# Patient Record
Sex: Male | Born: 1990
Health system: Southern US, Community
[De-identification: ages and names within clinical notes are randomized; demographics above are authoritative.]

## PROBLEM LIST (undated history)

## (undated) DIAGNOSIS — F319 Bipolar disorder, unspecified: Secondary | ICD-10-CM

---
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
## 2015-09-13 DIAGNOSIS — F3173 Bipolar disorder, in partial remission, most recent episode manic: Secondary | ICD-10-CM | POA: Diagnosis not present

## 2015-11-07 ENCOUNTER — Emergency Department (HOSPITAL_COMMUNITY)
Admission: EM | Admit: 2015-11-07 | Discharge: 2015-11-07 | Disposition: A | Discharge status: Home / Self Care | Payer: Self-pay | Attending: Emergency Medicine | Admitting: Emergency Medicine

## 2015-11-07 ENCOUNTER — Encounter (HOSPITAL_COMMUNITY): Payer: Self-pay

## 2015-11-07 DIAGNOSIS — Z87891 Personal history of nicotine dependence: Secondary | ICD-10-CM | POA: Diagnosis not present

## 2015-11-07 DIAGNOSIS — J069 Acute upper respiratory infection, unspecified: Secondary | ICD-10-CM | POA: Insufficient documentation

## 2015-11-07 DIAGNOSIS — B9789 Other viral agents as the cause of diseases classified elsewhere: Secondary | ICD-10-CM

## 2015-11-07 DIAGNOSIS — R000 Tachycardia, unspecified: Secondary | ICD-10-CM | POA: Insufficient documentation

## 2015-11-07 DIAGNOSIS — R05 Cough: Secondary | ICD-10-CM | POA: Diagnosis not present

## 2015-11-07 DIAGNOSIS — Z8659 Personal history of other mental and behavioral disorders: Secondary | ICD-10-CM | POA: Insufficient documentation

## 2015-11-07 HISTORY — DX: Bipolar disorder, unspecified: F31.9

## 2015-11-07 MED ORDER — BENZONATATE 100 MG PO CAPS: 100.0000 mg | ORAL_CAPSULE | Freq: Three times a day (TID) | ORAL | Status: DC

## 2015-11-07 NOTE — ED Provider Notes (Signed)
CSN: [id number]     Arrival date & time 11/07/15  [id number] History  By signing my name below, I, Doreatha MartinEva Mathews, attest that this documentation has been prepared under the direction and in the presence of Derwood KaplanAnkit Benna Arno, MD. Electronically Signed: Doreatha MartinEva Mathews, ED Scribe. 11/07/2015. 4:00 AM.    Chief Complaint  Patient presents with  . Cough   The history is provided by the patient. No language interpreter was used.   HPI Comments: Shawn Ford is a 25 y.o. male who presents to the Emergency Department complaining of moderate, intermittent productive cough with green/yellow sputum onset yesterday with associated HA, generalized myalgias, chest tenderness with coughing. Pt states that he came to the ED because he left work early at [employer] d/t his illness and was required to get a doctor's note. Pt notes sick contact with sister who has similar symptoms. Pt denies taking OTC medications at home to improve symptoms. He denies SOB, sore throat.    Past Medical History  Diagnosis Date  . Bipolar 1 disorder [facility](HCC)    Past Surgical History  Procedure Laterality Date  . Tympanostomy tube placement Bilateral 1995   No family history on file. Social History  Substance Use Topics  . Smoking status: Former Games developermoker  . Smokeless tobacco: Never Used  . Alcohol Use: Yes     Comment: socially    Review of Systems A complete 10 system review of systems was obtained and all systems are negative except as noted in the HPI and PMH.    Allergies  Review of patient's allergies indicates no known allergies.  Home Medications   Prior to Admission medications   Medication Sig Start Date End Date Taking? Authorizing Provider  benzonatate (TESSALON) 100 MG capsule Take 1 capsule (100 mg total) by mouth every 8 (eight) hours. 11/07/15   Donis Pinder, MD   BP 139/82 mmHg  Pulse 96  Temp(Src) 99.4 F (37.4 C) (Oral)  Resp 20  Ht 5\' 9"  (1.753 m)  Wt 185 lb (83.915 kg)  BMI 27.31 kg/m2  SpO2 99% Physical  Exam  Constitutional: He is oriented to person, place, and time. He appears well-developed and well-nourished.  HENT:  Head: Normocephalic and atraumatic.  Eyes: Conjunctivae and EOM are normal. Pupils are equal, round, and reactive to light.  Neck: Normal range of motion. Neck supple.  Cardiovascular: Regular rhythm and normal heart sounds.  Tachycardia present.  Exam reveals no gallop and no friction rub.   No murmur heard. Pulmonary/Chest: Effort normal and breath sounds normal. No respiratory distress. He has no wheezes. He has no rales.  Lungs CTA bilaterally.   Abdominal: He exhibits no distension.  Musculoskeletal: Normal range of motion.  Lymphadenopathy:    He has cervical adenopathy.  Neurological: He is alert and oriented to person, place, and time.  Skin: Skin is warm and dry.  Psychiatric: He has a normal mood and affect. His behavior is normal.  Nursing note and vitals reviewed.   ED Course  Procedures (including critical care time) DIAGNOSTIC STUDIES: Oxygen Saturation is 96% on RA, adequate by my interpretation.    COORDINATION OF CARE: 3:51 AM Discussed treatment plan with pt at bedside which includes symptomatic therapy and pt agreed to plan.    MDM   Final diagnoses:  Viral URI with cough    I personally performed the services described in this documentation, which was scribed in my presence. The recorded information has been reviewed and is accurate.   Pt comes  in with, what appears to be early onset URI. No fevers, no pleuritic chest pain with the cough, lungs are clear. WE will not get CXR or any lab work due to that. PT needs a work note- and that is the primary reason for ER visit -and we shall provide that.   Derwood Kaplan, MD 11/07/15 [phone]

## 2015-11-07 NOTE — Discharge Instructions (Signed)
Upper Respiratory Infection, Adult Most upper respiratory infections (URIs) are a viral infection of the air passages leading to the lungs. A URI affects the nose, throat, and upper air passages. The most common type of URI is nasopharyngitis and is typically referred to as "the common cold." URIs run their course and usually go away on their own. Most of the time, a URI does not require medical attention, but sometimes a bacterial infection in the upper airways can follow a viral infection. This is called a secondary infection. Sinus and middle ear infections are common types of secondary upper respiratory infections. Bacterial pneumonia can also complicate a URI. A URI can worsen asthma and chronic obstructive pulmonary disease (COPD). Sometimes, these complications can require emergency medical care and may be life threatening.  CAUSES Almost all URIs are caused by viruses. A virus is a type of germ and can spread from one person to another.  RISKS FACTORS You may be at risk for a URI if:   You smoke.   You have chronic heart or lung disease.  You have a weakened defense (immune) system.   You are very young or very old.   You have nasal allergies or asthma.  You work in crowded or poorly ventilated areas.  You work in health care facilities or schools. SIGNS AND SYMPTOMS  Symptoms typically develop 2-3 days after you come in contact with a cold virus. Most viral URIs last 7-10 days. However, viral URIs from the influenza virus (flu virus) can last 14-18 days and are typically more severe. Symptoms may include:   Runny or stuffy (congested) nose.   Sneezing.   Cough.   Sore throat.   Headache.   Fatigue.   Fever.   Loss of appetite.   Pain in your forehead, behind your eyes, and over your cheekbones (sinus pain).  Muscle aches.  DIAGNOSIS  Your health care provider may diagnose a URI by:  Physical exam.  Tests to check that your symptoms are not due to  another condition such as:  Strep throat.  Sinusitis.  Pneumonia.  Asthma. TREATMENT  A URI goes away on its own with time. It cannot be cured with medicines, but medicines may be prescribed or recommended to relieve symptoms. Medicines may help:  Reduce your fever.  Reduce your cough.  Relieve nasal congestion. HOME CARE INSTRUCTIONS   Take medicines only as directed by your health care provider.   Gargle warm saltwater or take cough drops to comfort your throat as directed by your health care provider.  Use a warm mist humidifier or inhale steam from a shower to increase air moisture. This may make it easier to breathe.  Drink enough fluid to keep your urine clear or pale yellow.   Eat soups and other clear broths and maintain good nutrition.   Rest as needed.   Return to work when your temperature has returned to normal or as your health care provider advises. You may need to stay home longer to avoid infecting others. You can also use a face mask and careful hand washing to prevent spread of the virus.  Increase the usage of your inhaler if you have asthma.   Do not use any tobacco products, including cigarettes, chewing tobacco, or electronic cigarettes. If you need help quitting, ask your health care provider. PREVENTION  The best way to protect yourself from getting a cold is to practice good hygiene.   Avoid oral or hand contact with people with cold   symptoms.   Wash your hands often if contact occurs.  There is no clear evidence that vitamin C, vitamin E, echinacea, or exercise reduces the chance of developing a cold. However, it is always recommended to get plenty of rest, exercise, and practice good nutrition.  SEEK MEDICAL CARE IF:   You are getting worse rather than better.   Your symptoms are not controlled by medicine.   You have chills.  You have worsening shortness of breath.  You have brown or red mucus.  You have yellow or brown nasal  discharge.  You have pain in your face, especially when you bend forward.  You have a fever.  You have swollen neck glands.  You have pain while swallowing.  You have white areas in the back of your throat. SEEK IMMEDIATE MEDICAL CARE IF:   You have severe or persistent:  Headache.  Ear pain.  Sinus pain.  Chest pain.  You have chronic lung disease and any of the following:  Wheezing.  Prolonged cough.  Coughing up blood.  A change in your usual mucus.  You have a stiff neck.  You have changes in your:  Vision.  Hearing.  Thinking.  Mood. MAKE SURE YOU:   Understand these instructions.  Will watch your condition.  Will get help right away if you are not doing well or get worse.   This information is not intended to replace advice given to you by your health care provider. Make sure you discuss any questions you have with your health care provider.   Document Released: 01/31/2001 Document Revised: 12/22/2014 Document Reviewed: 11/12/2013 Elsevier Interactive Patient Education 2016 Elsevier Inc.  

## 2015-11-07 NOTE — ED Notes (Signed)
Dr. Nanavati at bedside 

## 2015-11-07 NOTE — ED Notes (Signed)
Per pt he left work early, was coughing and had a headache and felt tired. Pt states "I felt like I was having a cold". Pt states that his boss said he needed a work note for leaving early and this was the only place he knew that was open. Pt denies productive cough. Pt states his symptoms started tonight. Pt did not take any medication.

## 2017-01-24 ENCOUNTER — Emergency Department (HOSPITAL_COMMUNITY): Payer: 59

## 2017-01-24 ENCOUNTER — Encounter (HOSPITAL_COMMUNITY)
Admission: EM | Disposition: A | Discharge status: Home / Self Care | Payer: Self-pay | Source: Home / Self Care | Attending: Orthopedic Surgery

## 2017-01-24 ENCOUNTER — Encounter (HOSPITAL_COMMUNITY): Payer: Self-pay | Admitting: Emergency Medicine

## 2017-01-24 ENCOUNTER — Inpatient Hospital Stay (HOSPITAL_COMMUNITY)
Admission: EM | Admit: 2017-01-24 | Discharge: 2017-01-25 | DRG: 512 | Disposition: A | Discharge status: Home / Self Care | Payer: 59 | Attending: Orthopedic Surgery | Admitting: Orthopedic Surgery

## 2017-01-24 ENCOUNTER — Emergency Department (HOSPITAL_COMMUNITY): Payer: 59 | Admitting: Registered Nurse

## 2017-01-24 DIAGNOSIS — F17200 Nicotine dependence, unspecified, uncomplicated: Secondary | ICD-10-CM | POA: Diagnosis present

## 2017-01-24 DIAGNOSIS — F319 Bipolar disorder, unspecified: Secondary | ICD-10-CM | POA: Diagnosis not present

## 2017-01-24 DIAGNOSIS — S52332A Displaced oblique fracture of shaft of left radius, initial encounter for closed fracture: Principal | ICD-10-CM | POA: Diagnosis present

## 2017-01-24 DIAGNOSIS — S52602A Unspecified fracture of lower end of left ulna, initial encounter for closed fracture: Secondary | ICD-10-CM | POA: Diagnosis not present

## 2017-01-24 DIAGNOSIS — R51 Headache: Secondary | ICD-10-CM | POA: Diagnosis not present

## 2017-01-24 DIAGNOSIS — M79632 Pain in left forearm: Secondary | ICD-10-CM | POA: Diagnosis present

## 2017-01-24 DIAGNOSIS — R0781 Pleurodynia: Secondary | ICD-10-CM | POA: Diagnosis not present

## 2017-01-24 DIAGNOSIS — S298XXA Other specified injuries of thorax, initial encounter: Secondary | ICD-10-CM

## 2017-01-24 DIAGNOSIS — S6982XA Other specified injuries of left wrist, hand and finger(s), initial encounter: Secondary | ICD-10-CM | POA: Diagnosis not present

## 2017-01-24 DIAGNOSIS — S52302A Unspecified fracture of shaft of left radius, initial encounter for closed fracture: Secondary | ICD-10-CM | POA: Diagnosis not present

## 2017-01-24 DIAGNOSIS — S299XXA Unspecified injury of thorax, initial encounter: Secondary | ICD-10-CM | POA: Diagnosis not present

## 2017-01-24 DIAGNOSIS — L0889 Other specified local infections of the skin and subcutaneous tissue: Secondary | ICD-10-CM | POA: Diagnosis not present

## 2017-01-24 DIAGNOSIS — S52392A Other fracture of shaft of radius, left arm, initial encounter for closed fracture: Secondary | ICD-10-CM

## 2017-01-24 DIAGNOSIS — S52502A Unspecified fracture of the lower end of left radius, initial encounter for closed fracture: Secondary | ICD-10-CM | POA: Diagnosis not present

## 2017-01-24 DIAGNOSIS — S060X0A Concussion without loss of consciousness, initial encounter: Secondary | ICD-10-CM | POA: Diagnosis present

## 2017-01-24 DIAGNOSIS — S5290XA Unspecified fracture of unspecified forearm, initial encounter for closed fracture: Secondary | ICD-10-CM | POA: Diagnosis present

## 2017-01-24 DIAGNOSIS — G8918 Other acute postprocedural pain: Secondary | ICD-10-CM | POA: Diagnosis not present

## 2017-01-24 HISTORY — PX: ORIF RADIAL FRACTURE: SHX5113

## 2017-01-24 LAB — BASIC METABOLIC PANEL
ANION GAP: 11 (ref 5–15)
BUN: 14 mg/dL (ref 6–20)
CALCIUM: 9.4 mg/dL (ref 8.9–10.3)
CO2: 24 mmol/L (ref 22–32)
Chloride: 107 mmol/L (ref 101–111)
Creatinine, Ser: 0.92 mg/dL (ref 0.61–1.24)
GFR calc Af Amer: >60 mL/min (ref >60)
GLUCOSE: 98 mg/dL (ref 65–99)
Potassium: 3.2 mmol/L — ABNORMAL LOW (ref 3.5–5.1)
Sodium: 142 mmol/L (ref 135–145)

## 2017-01-24 LAB — CBC WITH DIFFERENTIAL/PLATELET
BASOS ABS: 0 10*3/uL (ref 0.0–0.1)
Basophils Relative: 0 %
EOS ABS: 0.1 10*3/uL (ref 0.0–0.7)
EOS PCT: 1 %
HCT: 44.6 % (ref 39.0–52.0)
Hemoglobin: 15.5 g/dL (ref 13.0–17.0)
LYMPHS PCT: 15 %
Lymphs Abs: 1.9 10*3/uL (ref 0.7–4.0)
MCH: 29.8 pg (ref 26.0–34.0)
MCHC: 34.8 g/dL (ref 30.0–36.0)
MCV: 85.8 fL (ref 78.0–100.0)
MONO ABS: 0.9 10*3/uL (ref 0.1–1.0)
Monocytes Relative: 7 %
Neutro Abs: 9.6 10*3/uL — ABNORMAL HIGH (ref 1.7–7.7)
Neutrophils Relative %: 77 %
PLATELETS: 135 10*3/uL — AB (ref 150–400)
RBC: 5.2 MIL/uL (ref 4.22–5.81)
RDW: 13.3 % (ref 11.5–15.5)
WBC: 12.5 10*3/uL — ABNORMAL HIGH (ref 4.0–10.5)

## 2017-01-24 LAB — RAPID URINE DRUG SCREEN, HOSP PERFORMED
Amphetamines: NOT DETECTED
BARBITURATES: NOT DETECTED
Benzodiazepines: NOT DETECTED
COCAINE: NOT DETECTED
Opiates: NOT DETECTED
Tetrahydrocannabinol: POSITIVE — AB

## 2017-01-24 LAB — URINALYSIS, ROUTINE W REFLEX MICROSCOPIC
BILIRUBIN URINE: NEGATIVE
GLUCOSE, UA: NEGATIVE mg/dL
HGB URINE DIPSTICK: NEGATIVE
Ketones, ur: NEGATIVE mg/dL
Leukocytes, UA: NEGATIVE
Nitrite: NEGATIVE
PROTEIN: NEGATIVE mg/dL
Specific Gravity, Urine: 1.004 — ABNORMAL LOW (ref 1.005–1.030)
pH: 6 (ref 5.0–8.0)

## 2017-01-24 LAB — ETHANOL: Alcohol, Ethyl (B): 32 mg/dL — ABNORMAL HIGH (ref <5)

## 2017-01-24 SURGERY — OPEN REDUCTION INTERNAL FIXATION (ORIF) RADIAL FRACTURE
Anesthesia: General | Site: Arm Lower | Laterality: Left

## 2017-01-24 MED ORDER — LIDOCAINE 2% (20 MG/ML) 5 ML SYRINGE
INTRAMUSCULAR | Status: AC
  Filled 2017-01-24: qty 5

## 2017-01-24 MED ORDER — SUGAMMADEX SODIUM 200 MG/2ML IV SOLN
INTRAVENOUS | Status: AC
  Filled 2017-01-24: qty 2

## 2017-01-24 MED ORDER — LACTATED RINGERS IV SOLN
INTRAVENOUS | Status: DC
  Administered 2017-01-24: 23:00:00 via INTRAVENOUS

## 2017-01-24 MED ORDER — OXYCODONE HCL 5 MG PO TABS
5.0000 mg | ORAL_TABLET | ORAL | Status: DC | PRN
  Administered 2017-01-24: 10 mg via ORAL
  Administered 2017-01-24: 5 mg via ORAL
  Administered 2017-01-25 (×3): 10 mg via ORAL
  Filled 2017-01-24 (×4): qty 2
  Filled 2017-01-24: qty 1

## 2017-01-24 MED ORDER — ONDANSETRON HCL 4 MG PO TABS: 4.0000 mg | ORAL_TABLET | Freq: Four times a day (QID) | ORAL | Status: DC | PRN

## 2017-01-24 MED ORDER — MORPHINE SULFATE (PF) 2 MG/ML IV SOLN
1.0000 mg | INTRAVENOUS | Status: DC | PRN
  Administered 2017-01-24: 1 mg via INTRAVENOUS
  Filled 2017-01-24: qty 1

## 2017-01-24 MED ORDER — SUCCINYLCHOLINE CHLORIDE 200 MG/10ML IV SOSY
PREFILLED_SYRINGE | INTRAVENOUS | Status: AC
  Filled 2017-01-24: qty 10

## 2017-01-24 MED ORDER — TETANUS-DIPHTH-ACELL PERTUSSIS 5-2.5-18.5 LF-MCG/0.5 IM SUSP
0.5000 mL | Freq: Once | INTRAMUSCULAR | Status: AC
  Administered 2017-01-24: 0.5 mL via INTRAMUSCULAR
  Filled 2017-01-24: qty 0.5

## 2017-01-24 MED ORDER — FENTANYL CITRATE (PF) 100 MCG/2ML IJ SOLN
50.0000 ug | Freq: Once | INTRAMUSCULAR | Status: AC
  Administered 2017-01-24: 50 ug via INTRAVENOUS
  Filled 2017-01-24: qty 2

## 2017-01-24 MED ORDER — METHOCARBAMOL 500 MG PO TABS
500.0000 mg | ORAL_TABLET | Freq: Four times a day (QID) | ORAL | Status: DC | PRN
  Administered 2017-01-24 – 2017-01-25 (×2): 500 mg via ORAL
  Filled 2017-01-24 (×2): qty 1

## 2017-01-24 MED ORDER — MIDAZOLAM HCL 5 MG/5ML IJ SOLN
INTRAMUSCULAR | Status: DC | PRN
  Administered 2017-01-24: 2 mg via INTRAVENOUS

## 2017-01-24 MED ORDER — CEFAZOLIN SODIUM-DEXTROSE 1-4 GM/50ML-% IV SOLN
1.0000 g | Freq: Three times a day (TID) | INTRAVENOUS | Status: DC
  Administered 2017-01-24 – 2017-01-25 (×2): 1 g via INTRAVENOUS
  Filled 2017-01-24 (×4): qty 50

## 2017-01-24 MED ORDER — METOCLOPRAMIDE HCL 5 MG/ML IJ SOLN: 10.0000 mg | Freq: Once | INTRAMUSCULAR | Status: DC | PRN

## 2017-01-24 MED ORDER — VITAMIN C 500 MG PO TABS
1000.0000 mg | ORAL_TABLET | Freq: Every day | ORAL | Status: DC
  Administered 2017-01-24 – 2017-01-25 (×2): 1000 mg via ORAL
  Filled 2017-01-24 (×3): qty 2

## 2017-01-24 MED ORDER — METHOCARBAMOL 1000 MG/10ML IJ SOLN
500.0000 mg | Freq: Four times a day (QID) | INTRAVENOUS | Status: DC | PRN
  Administered 2017-01-24: 500 mg via INTRAVENOUS
  Filled 2017-01-24: qty 550

## 2017-01-24 MED ORDER — LIDOCAINE 2% (20 MG/ML) 5 ML SYRINGE
INTRAMUSCULAR | Status: DC | PRN
  Administered 2017-01-24: 100 mg via INTRAVENOUS

## 2017-01-24 MED ORDER — CEFAZOLIN SODIUM-DEXTROSE 2-4 GM/100ML-% IV SOLN
INTRAVENOUS | Status: AC
Start: 2017-01-24 — End: 2017-01-24
  Filled 2017-01-24: qty 100

## 2017-01-24 MED ORDER — CEFAZOLIN SODIUM-DEXTROSE 1-4 GM/50ML-% IV SOLN
1.0000 g | Freq: Once | INTRAVENOUS | Status: AC
  Administered 2017-01-24: 1 g via INTRAVENOUS
  Filled 2017-01-24: qty 50

## 2017-01-24 MED ORDER — FAMOTIDINE 20 MG PO TABS: 20.0000 mg | ORAL_TABLET | Freq: Two times a day (BID) | ORAL | Status: DC | PRN

## 2017-01-24 MED ORDER — ALPRAZOLAM 0.5 MG PO TABS: 0.5000 mg | ORAL_TABLET | Freq: Four times a day (QID) | ORAL | Status: DC | PRN

## 2017-01-24 MED ORDER — BUPIVACAINE-EPINEPHRINE (PF) 0.5% -1:200000 IJ SOLN
INTRAMUSCULAR | Status: DC | PRN
  Administered 2017-01-24: 30 mL via PERINEURAL

## 2017-01-24 MED ORDER — PROPOFOL 10 MG/ML IV BOLUS
INTRAVENOUS | Status: DC | PRN
  Administered 2017-01-24: 180 mg via INTRAVENOUS

## 2017-01-24 MED ORDER — LACTATED RINGERS IV SOLN: INTRAVENOUS | Status: DC

## 2017-01-24 MED ORDER — LACTATED RINGERS IV SOLN
INTRAVENOUS | Status: DC | PRN
  Administered 2017-01-24: 07:00:00 via INTRAVENOUS

## 2017-01-24 MED ORDER — ROCURONIUM BROMIDE 50 MG/5ML IV SOSY
PREFILLED_SYRINGE | INTRAVENOUS | Status: AC
  Filled 2017-01-24: qty 5

## 2017-01-24 MED ORDER — SUGAMMADEX SODIUM 200 MG/2ML IV SOLN
INTRAVENOUS | Status: DC | PRN
  Administered 2017-01-24: 170 mg via INTRAVENOUS

## 2017-01-24 MED ORDER — ONDANSETRON HCL 4 MG/2ML IJ SOLN: 4.0000 mg | Freq: Four times a day (QID) | INTRAMUSCULAR | Status: DC | PRN

## 2017-01-24 MED ORDER — BUPIVACAINE HCL (PF) 0.25 % IJ SOLN
INTRAMUSCULAR | Status: AC
Start: 2017-01-24 — End: 2017-01-24
  Filled 2017-01-24: qty 30

## 2017-01-24 MED ORDER — PROPOFOL 10 MG/ML IV BOLUS
INTRAVENOUS | Status: AC
  Filled 2017-01-24: qty 20

## 2017-01-24 MED ORDER — ONDANSETRON HCL 4 MG/2ML IJ SOLN
INTRAMUSCULAR | Status: AC
  Filled 2017-01-24: qty 2

## 2017-01-24 MED ORDER — ONDANSETRON HCL 4 MG/2ML IJ SOLN
4.0000 mg | Freq: Once | INTRAMUSCULAR | Status: AC
  Administered 2017-01-24: 4 mg via INTRAVENOUS
  Filled 2017-01-24: qty 2

## 2017-01-24 MED ORDER — FENTANYL CITRATE (PF) 100 MCG/2ML IJ SOLN: 25.0000 ug | INTRAMUSCULAR | Status: DC | PRN

## 2017-01-24 MED ORDER — ROCURONIUM BROMIDE 10 MG/ML (PF) SYRINGE
PREFILLED_SYRINGE | INTRAVENOUS | Status: DC | PRN
  Administered 2017-01-24: 20 mg via INTRAVENOUS

## 2017-01-24 MED ORDER — SUCCINYLCHOLINE CHLORIDE 200 MG/10ML IV SOSY
PREFILLED_SYRINGE | INTRAVENOUS | Status: DC | PRN
  Administered 2017-01-24: 100 mg via INTRAVENOUS

## 2017-01-24 MED ORDER — CEFAZOLIN SODIUM-DEXTROSE 2-3 GM-% IV SOLR
INTRAVENOUS | Status: DC | PRN
  Administered 2017-01-24: 2 g via INTRAVENOUS

## 2017-01-24 MED ORDER — 0.9 % SODIUM CHLORIDE (POUR BTL) OPTIME
TOPICAL | Status: DC | PRN
  Administered 2017-01-24: 1000 mL

## 2017-01-24 MED ORDER — MEPERIDINE HCL 50 MG/ML IJ SOLN: 6.2500 mg | INTRAMUSCULAR | Status: DC | PRN

## 2017-01-24 MED ORDER — LIP MEDEX EX OINT
TOPICAL_OINTMENT | CUTANEOUS | Status: AC
  Filled 2017-01-24: qty 7

## 2017-01-24 MED ORDER — MIDAZOLAM HCL 2 MG/2ML IJ SOLN
INTRAMUSCULAR | Status: AC
  Filled 2017-01-24: qty 2

## 2017-01-24 MED ORDER — DOCUSATE SODIUM 100 MG PO CAPS
100.0000 mg | ORAL_CAPSULE | Freq: Two times a day (BID) | ORAL | Status: DC
  Administered 2017-01-24 – 2017-01-25 (×2): 100 mg via ORAL
  Filled 2017-01-24 (×2): qty 1

## 2017-01-24 MED ORDER — FENTANYL CITRATE (PF) 100 MCG/2ML IJ SOLN
INTRAMUSCULAR | Status: DC | PRN
  Administered 2017-01-24 (×2): 50 ug via INTRAVENOUS

## 2017-01-24 MED ORDER — PROMETHAZINE HCL 12.5 MG RE SUPP
12.5000 mg | Freq: Four times a day (QID) | RECTAL | Status: DC | PRN
  Filled 2017-01-24: qty 1

## 2017-01-24 MED ORDER — FENTANYL CITRATE (PF) 250 MCG/5ML IJ SOLN
INTRAMUSCULAR | Status: AC
Start: 2017-01-24 — End: 2017-01-24
  Filled 2017-01-24: qty 5

## 2017-01-24 MED ORDER — ONDANSETRON HCL 4 MG/2ML IJ SOLN
INTRAMUSCULAR | Status: DC | PRN
  Administered 2017-01-24: 4 mg via INTRAVENOUS

## 2017-01-24 SURGICAL SUPPLY — 57 items
BAG ZIPLOCK 12X15 (MISCELLANEOUS) IMPLANT
BANDAGE ACE 4X5 VEL STRL LF (GAUZE/BANDAGES/DRESSINGS) ×2 IMPLANT
BIT DRILL 2.5X205 (BIT) ×1 IMPLANT
BIT DRILL 2.7X100 LONG (BIT) ×2 IMPLANT
BNDG GAUZE ELAST 4 BULKY (GAUZE/BANDAGES/DRESSINGS) ×2 IMPLANT
CORDS BIPOLAR (ELECTRODE) ×2 IMPLANT
COVER SURGICAL LIGHT HANDLE (MISCELLANEOUS) ×2 IMPLANT
CUFF TOURN SGL QUICK 18 (TOURNIQUET CUFF) ×2 IMPLANT
DECANTER SPIKE VIAL GLASS SM (MISCELLANEOUS) IMPLANT
DRAPE OEC MINIVIEW 54X84 (DRAPES) ×2 IMPLANT
DRAPE SURG 17X11 SM STRL (DRAPES) ×2 IMPLANT
DRAPE U-SHAPE 47X51 STRL (DRAPES) IMPLANT
DRILL BIT 2.5X205 (BIT) ×1
DRSG ADAPTIC 3X8 NADH LF (GAUZE/BANDAGES/DRESSINGS) ×2 IMPLANT
DRSG PAD ABDOMINAL 8X10 ST (GAUZE/BANDAGES/DRESSINGS) IMPLANT
ELECT REM PT RETURN 15FT ADLT (MISCELLANEOUS) ×2 IMPLANT
EVACUATOR 1/8 PVC DRAIN (DRAIN) IMPLANT
GAUZE SPONGE 4X4 12PLY STRL (GAUZE/BANDAGES/DRESSINGS) ×2 IMPLANT
GAUZE SPONGE 4X4 16PLY XRAY LF (GAUZE/BANDAGES/DRESSINGS) ×2 IMPLANT
GAUZE XEROFORM 5X9 LF (GAUZE/BANDAGES/DRESSINGS) ×2 IMPLANT
GLOVE BIO SURGEON STRL SZ8 (GLOVE) ×2 IMPLANT
GOWN STRL REUS W/TWL XL LVL3 (GOWN DISPOSABLE) ×2 IMPLANT
KIT BASIN OR (CUSTOM PROCEDURE TRAY) ×2 IMPLANT
KWIRE 4.0 X .045IN (WIRE) IMPLANT
KWIRE 4.0 X .062IN (WIRE) IMPLANT
MANIFOLD NEPTUNE II (INSTRUMENTS) ×2 IMPLANT
NS IRRIG 1000ML POUR BTL (IV SOLUTION) ×2 IMPLANT
PACK ORTHO EXTREMITY (CUSTOM PROCEDURE TRAY) ×2 IMPLANT
PAD CAST 3X4 CTTN HI CHSV (CAST SUPPLIES) IMPLANT
PAD CAST 4YDX4 CTTN HI CHSV (CAST SUPPLIES) ×2 IMPLANT
PADDING CAST ABS 4INX4YD NS (CAST SUPPLIES) ×1
PADDING CAST ABS COTTON 4X4 ST (CAST SUPPLIES) ×1 IMPLANT
PADDING CAST COTTON 3X4 STRL (CAST SUPPLIES)
PADDING CAST COTTON 4X4 STRL (CAST SUPPLIES) ×2
PLATE 8 HOLE LOCKING 3.5MM (Plate) ×2 IMPLANT
POSITIONER SURGICAL ARM (MISCELLANEOUS) ×2 IMPLANT
SCREW CORTICAL 3.5X14 (Screw) ×8 IMPLANT
SCREW LOCKING 16X3.5MM (Screw) ×4 IMPLANT
SPLINT FIBERGLASS 4X30 (CAST SUPPLIES) ×2 IMPLANT
SUCTION FRAZIER HANDLE 12FR (TUBING) ×1
SUCTION TUBE FRAZIER 12FR DISP (TUBING) ×1 IMPLANT
SUT BONE WAX W31G (SUTURE) IMPLANT
SUT ETHILON 6 0 PS 3 18 (SUTURE) IMPLANT
SUT MERSILENE 4 0 P 3 (SUTURE) IMPLANT
SUT MNCRL AB 4-0 PS2 18 (SUTURE) IMPLANT
SUT PROLENE 3 0 PS 2 (SUTURE) ×8 IMPLANT
SUT PROLENE 4 0 P 3 18 (SUTURE) IMPLANT
SUT PROLENE 4 0 RB 1 (SUTURE)
SUT PROLENE 4-0 RB1 .5 CRCL 36 (SUTURE) IMPLANT
SUT VIC AB 0 CT1 27 (SUTURE)
SUT VIC AB 0 CT1 27XBRD ANTBC (SUTURE) IMPLANT
SUT VIC AB 2-0 CT1 27 (SUTURE)
SUT VIC AB 2-0 CT1 TAPERPNT 27 (SUTURE) IMPLANT
TOWEL OR 17X26 10 PK STRL BLUE (TOWEL DISPOSABLE) ×4 IMPLANT
UNDERPAD 30X30 (UNDERPADS AND DIAPERS) ×2 IMPLANT
WATER STERILE IRR 1500ML POUR (IV SOLUTION) IMPLANT
YANKAUER SUCT BULB TIP 10FT TU (MISCELLANEOUS) ×2 IMPLANT
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
## 2017-01-24 NOTE — Anesthesia Postprocedure Evaluation (Signed)
Anesthesia Post Note  Patient: Jovita KussmaulJoseph G Alejandro  Procedure(s) Performed: Procedure(s) (LRB): OPEN REDUCTION INTERNAL FIXATION (ORIF) RADIAL FRACTURE (Left)     Patient location during evaluation: PACU Anesthesia Type: General and Regional Level of consciousness: awake and alert Pain management: pain level controlled Vital Signs Assessment: post-procedure vital signs reviewed and stable Respiratory status: spontaneous breathing, nonlabored ventilation, respiratory function stable and patient connected to nasal cannula oxygen Cardiovascular status: blood pressure returned to baseline and stable Postop Assessment: no signs of nausea or vomiting Anesthetic complications: no    Last Vitals:  Vitals:   01/24/17 1405 01/24/17 1517  BP: (!) 148/76 (!) 148/75  Pulse: 98 94  Resp: 17 18  Temp: 36.6 C 36.7 C    Last Pain:  Vitals:   01/24/17 1517  TempSrc: Oral  PainSc:                  Phillips Groutarignan, Jalayna Josten

## 2017-01-24 NOTE — Anesthesia Procedure Notes (Addendum)
Anesthesia Regional Block: Supraclavicular block   Pre-Anesthetic Checklist: ,, timeout performed, Correct Patient, Correct Site, Correct Laterality, Correct Procedure, Correct Position, site marked, Risks and benefits discussed,  Surgical consent,  Pre-op evaluation,  At surgeon's request and post-op pain management  Laterality: Left and Upper  Prep: Maximum Sterile Barrier Precautions used, chloraprep       Needles:  Injection technique: Single-shot  Needle Type: Echogenic Stimulator Needle     Needle Length: 10cm      Additional Needles:   Procedures: ultrasound guided,,,,,,,,  Narrative:  Start time: 01/24/2017 6:55 AM End time: 01/24/2017 7:05 AM Injection made incrementally with aspirations every 5 mL.  Performed by: Personally  Anesthesiologist: Phillips GroutARIGNAN, Kennesha Brewbaker  Additional Notes: Risks, benefits and alternative to block explained extensively.  Patient tolerated procedure well, without complications.

## 2017-01-24 NOTE — Transfer of Care (Signed)
Immediate Anesthesia Transfer of Care Note  Patient: Shawn KussmaulJoseph G Ford  Procedure(s) Performed: Procedure(s): OPEN REDUCTION INTERNAL FIXATION (ORIF) RADIAL FRACTURE (Left)  Patient Location: PACU  Anesthesia Type:General  Level of Consciousness: awake, alert , oriented and patient cooperative  Airway & Oxygen Therapy: Patient Spontanous Breathing and Patient connected to face mask oxygen  Post-op Assessment: Report given to RN, Post -op Vital signs reviewed and stable and Patient moving all extremities  Post vital signs: Reviewed and stable  Last Vitals:  Vitals:   01/24/17 0506 01/24/17 0622  BP: (!) 144/81 138/84  Pulse: (!) 102 94  Resp:  16  Temp:  36.8 C    Last Pain:  Vitals:   01/24/17 0647  TempSrc:   PainSc: 6          Complications: No apparent anesthesia complications
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
## 2017-01-24 NOTE — Anesthesia Preprocedure Evaluation (Addendum)
Anesthesia Evaluation  Patient identified by MRN, date of birth, ID band Patient awake    Reviewed: Allergy & Precautions, NPO status , Patient's Chart, lab work & pertinent test results  Airway Mallampati: II  TM Distance: >3 FB Neck ROM: Full    Dental no notable dental hx.    Pulmonary Current Smoker,    Pulmonary exam normal breath sounds clear to auscultation       Cardiovascular negative cardio ROS Normal cardiovascular exam Rhythm:Regular Rate:Normal     Neuro/Psych Bipolar Disorder negative neurological ROS     GI/Hepatic negative GI ROS, Neg liver ROS,   Endo/Other  negative endocrine ROS  Renal/GU negative Renal ROS  negative genitourinary   Musculoskeletal negative musculoskeletal ROS (+)   Abdominal   Peds negative pediatric ROS (+)  Hematology negative hematology ROS (+)   Anesthesia Other Findings   Reproductive/Obstetrics negative OB ROS                             Anesthesia Physical Anesthesia Plan  ASA: II  Anesthesia Plan: General   Post-op Pain Management:  Regional for Post-op pain and GA combined w/ Regional for post-op pain   Induction: Intravenous  PONV Risk Score and Plan: 1 and Ondansetron and Treatment may vary due to age  Airway Management Planned: Oral ETT  Additional Equipment:   Intra-op Plan:   Post-operative Plan: Extubation in OR  Informed Consent: I have reviewed the patients History and Physical, chart, labs and discussed the procedure including the risks, benefits and alternatives for the proposed anesthesia with the patient or authorized representative who has indicated his/her understanding and acceptance.   Dental advisory given  Plan Discussed with: CRNA  Anesthesia Plan Comments:         Anesthesia Quick Evaluation

## 2017-01-24 NOTE — Anesthesia Procedure Notes (Signed)
Procedure Name: Intubation Date/Time: 01/24/2017 7:37 AM Performed by: Carleene Cooper A Pre-anesthesia Checklist: Patient identified, Emergency Drugs available, Suction available, Patient being monitored and Timeout performed Patient Re-evaluated:Patient Re-evaluated prior to inductionOxygen Delivery Method: Circle system utilized Intubation Type: IV induction Ventilation: Mask ventilation without difficulty Laryngoscope Size: Mac and 4 Grade View: Grade I Tube type: Oral Tube size: 7.5 mm Number of attempts: 1 Airway Equipment and Method: Stylet Placement Confirmation: ETT inserted through vocal cords under direct vision,  positive ETCO2 and breath sounds checked- equal and bilateral Secured at: 21 cm Tube secured with: Tape Dental Injury: Teeth and Oropharynx as per pre-operative assessment
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
## 2017-01-24 NOTE — Op Note (Signed)
See full dictation  SP ORIF left forearm Eden Toohey MD
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
## 2017-01-25 ENCOUNTER — Encounter (HOSPITAL_COMMUNITY): Payer: Self-pay | Admitting: Orthopedic Surgery

## 2017-01-25 MED ORDER — OXYCODONE HCL 5 MG PO TABS: 5.0000 mg | ORAL_TABLET | ORAL | 0 refills | Status: DC | PRN

## 2017-01-25 MED ORDER — CIPROFLOXACIN HCL 500 MG PO TABS: 500.0000 mg | ORAL_TABLET | Freq: Two times a day (BID) | ORAL | 0 refills | Status: AC

## 2017-01-25 NOTE — Discharge Instructions (Signed)

## 2017-01-25 NOTE — Progress Notes (Signed)
Pt's vitals are WNL, tolerating diet and pain is under control. Discussed discharge instructions with patient. Discharged to home with prescriptions.  

## 2017-01-25 NOTE — Progress Notes (Signed)
PT Cancellation Note  Patient Details Name: Shawn KussmaulJoseph G Ford MRN: [id number] DOB: 12/24/1990   Cancelled Treatment:    Reason Eval/Treat Not Completed: PT screened, no needs identified, will sign off   Rada HayHill, Coy Rochford Elizabeth 01/25/2017, 3:04 PM
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
## 2017-01-31 DIAGNOSIS — F3173 Bipolar disorder, in partial remission, most recent episode manic: Secondary | ICD-10-CM | POA: Diagnosis not present

## 2017-02-01 ENCOUNTER — Other Ambulatory Visit: Payer: Self-pay | Admitting: *Deleted
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
## 2017-02-02 ENCOUNTER — Other Ambulatory Visit: Payer: Self-pay | Admitting: *Deleted
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
## 2017-02-05 ENCOUNTER — Other Ambulatory Visit: Payer: Self-pay | Admitting: *Deleted

## 2017-02-05 ENCOUNTER — Encounter: Payer: Self-pay | Admitting: *Deleted
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
## 2017-02-07 DIAGNOSIS — S52332D Displaced oblique fracture of shaft of left radius, subsequent encounter for closed fracture with routine healing: Secondary | ICD-10-CM | POA: Diagnosis not present

## 2017-02-07 DIAGNOSIS — Z4789 Encounter for other orthopedic aftercare: Secondary | ICD-10-CM | POA: Diagnosis not present

## 2017-02-22 DIAGNOSIS — S62102A Fracture of unspecified carpal bone, left wrist, initial encounter for closed fracture: Secondary | ICD-10-CM | POA: Diagnosis not present

## 2017-02-22 DIAGNOSIS — S52332D Displaced oblique fracture of shaft of left radius, subsequent encounter for closed fracture with routine healing: Secondary | ICD-10-CM | POA: Diagnosis not present

## 2017-03-02 DIAGNOSIS — S62102D Fracture of unspecified carpal bone, left wrist, subsequent encounter for fracture with routine healing: Secondary | ICD-10-CM | POA: Diagnosis not present

## 2017-03-09 DIAGNOSIS — S62102D Fracture of unspecified carpal bone, left wrist, subsequent encounter for fracture with routine healing: Secondary | ICD-10-CM | POA: Diagnosis not present

## 2017-03-16 DIAGNOSIS — S62102D Fracture of unspecified carpal bone, left wrist, subsequent encounter for fracture with routine healing: Secondary | ICD-10-CM | POA: Diagnosis not present

## 2017-04-02 ENCOUNTER — Ambulatory Visit (INDEPENDENT_AMBULATORY_CARE_PROVIDER_SITE_OTHER): Payer: 59 | Admitting: Psychology

## 2017-04-02 DIAGNOSIS — F341 Dysthymic disorder: Secondary | ICD-10-CM | POA: Diagnosis not present

## 2017-04-12 ENCOUNTER — Ambulatory Visit (INDEPENDENT_AMBULATORY_CARE_PROVIDER_SITE_OTHER): Payer: 59 | Admitting: Psychology

## 2017-04-12 DIAGNOSIS — F341 Dysthymic disorder: Secondary | ICD-10-CM | POA: Diagnosis not present

## 2017-04-13 DIAGNOSIS — S62102D Fracture of unspecified carpal bone, left wrist, subsequent encounter for fracture with routine healing: Secondary | ICD-10-CM | POA: Diagnosis not present

## 2017-04-20 DIAGNOSIS — F3173 Bipolar disorder, in partial remission, most recent episode manic: Secondary | ICD-10-CM | POA: Diagnosis not present

## 2017-04-27 ENCOUNTER — Ambulatory Visit: Payer: 59 | Admitting: Psychology

## 2017-05-08 ENCOUNTER — Ambulatory Visit (INDEPENDENT_AMBULATORY_CARE_PROVIDER_SITE_OTHER): Payer: 59 | Admitting: Psychology

## 2017-05-08 DIAGNOSIS — F341 Dysthymic disorder: Secondary | ICD-10-CM

## 2017-05-29 ENCOUNTER — Ambulatory Visit (INDEPENDENT_AMBULATORY_CARE_PROVIDER_SITE_OTHER): Payer: 59 | Admitting: Psychology

## 2017-05-29 DIAGNOSIS — F319 Bipolar disorder, unspecified: Secondary | ICD-10-CM

## 2017-06-25 DIAGNOSIS — H5213 Myopia, bilateral: Secondary | ICD-10-CM | POA: Diagnosis not present

## 2017-07-17 ENCOUNTER — Ambulatory Visit: Payer: Self-pay | Admitting: Psychology

## 2017-09-12 DIAGNOSIS — S52309A Unspecified fracture of shaft of unspecified radius, initial encounter for closed fracture: Secondary | ICD-10-CM | POA: Insufficient documentation

## 2017-09-12 DIAGNOSIS — S52302D Unspecified fracture of shaft of left radius, subsequent encounter for closed fracture with routine healing: Secondary | ICD-10-CM | POA: Diagnosis not present

## 2017-11-06 ENCOUNTER — Ambulatory Visit (HOSPITAL_COMMUNITY)
Admission: RE | Admit: 2017-11-06 | Discharge: 2017-11-06 | Disposition: A | Discharge status: Home / Self Care | Payer: 59 | Attending: Psychiatry | Admitting: Psychiatry

## 2017-11-06 DIAGNOSIS — F17200 Nicotine dependence, unspecified, uncomplicated: Secondary | ICD-10-CM | POA: Diagnosis not present

## 2017-11-06 DIAGNOSIS — F1290 Cannabis use, unspecified, uncomplicated: Secondary | ICD-10-CM | POA: Diagnosis not present

## 2017-11-06 DIAGNOSIS — F319 Bipolar disorder, unspecified: Secondary | ICD-10-CM | POA: Insufficient documentation

## 2017-11-06 NOTE — H&P (Signed)
Behavioral Health Medical Screening Exam  Shawn KussmaulJoseph G Manna is an [age] y.o. male who [facility] overwhelming anxiety. Patient denies suicide/homicide ideations as well as visual or auditory hallucination. Patient requesting OP resources for anxiety med management.   Total Time spent with patient: 30 minutes  Psychiatric Specialty Exam: Physical Exam  Vitals reviewed. Constitutional: He is oriented to person, place, and time. He appears well-developed and well-nourished.  HENT:  Head: Normocephalic and atraumatic.  Eyes: Pupils are equal, round, and reactive to light.  Neck: Normal range of motion.  Cardiovascular: Normal rate, regular rhythm and normal heart sounds.  Respiratory: Effort normal and breath sounds normal.  GI: Soft. Bowel sounds are normal.  Musculoskeletal: Normal range of motion.  Neurological: He is alert and oriented to person, place, and time.  Skin: Skin is warm and dry.       ROS  Blood pressure (!) 146/78, pulse 85, temperature 98.7 F (37.1 C), temperature source Oral, resp. rate 18, SpO2 100 %.There is no height or weight on file to calculate BMI.  General Appearance: Casual  Eye Contact:  Good  Speech:  Clear and Coherent and Normal Rate  Volume:  Decreased  Mood:  Anxious  Affect:  Flat  Thought Process:  Coherent and Goal Directed  Orientation:  Full (Time, Place, and Person)  Thought Content:  Logical  Suicidal Thoughts:  No  Homicidal Thoughts:  No  Memory:  Immediate;   Good Recent;   Good Remote;   Fair  Judgement:  Intact  Insight:  Present  Psychomotor Activity:  Normal  Concentration: Concentration: Good and Attention Span: Good  Recall:  Good  Fund of Knowledge:Good  Language: Good  Akathisia:  Negative  Handed:  Right  AIMS (if indicated):     Assets:  Communication Skills Desire for Improvement Financial Resources/Insurance Physical Health  Sleep:        Musculoskeletal: Strength & Muscle Tone: within normal limits Gait & Station: normal Patient leans: N/A  Blood pressure (!) 146/78, pulse 85, temperature 98.7 F (37.1 C), temperature source Oral, resp. rate 18, SpO2 100 %.  Recommendations:  Based on my evaluation the patient does not appear to have an emergency medical condition.  Delila PereyraJustina A Myrel Rappleye, NP 11/06/2017, 3:56 PM
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
## 2017-11-08 DIAGNOSIS — F3173 Bipolar disorder, in partial remission, most recent episode manic: Secondary | ICD-10-CM | POA: Diagnosis not present

## 2018-07-07 ENCOUNTER — Encounter: Payer: Self-pay | Admitting: Emergency Medicine

## 2018-07-07 DIAGNOSIS — F0630 Mood disorder due to known physiological condition, unspecified: Secondary | ICD-10-CM

## 2018-07-07 DIAGNOSIS — F4310 Post-traumatic stress disorder, unspecified: Secondary | ICD-10-CM | POA: Insufficient documentation

## 2018-07-10 ENCOUNTER — Encounter: Payer: Self-pay | Admitting: Nurse Practitioner

## 2018-07-10 ENCOUNTER — Ambulatory Visit (INDEPENDENT_AMBULATORY_CARE_PROVIDER_SITE_OTHER): Payer: Self-pay | Admitting: Nurse Practitioner

## 2018-07-10 VITALS — BP 110/72 | HR 101 | Temp 98.1°F | Wt 155.8 lb

## 2018-07-10 DIAGNOSIS — J069 Acute upper respiratory infection, unspecified: Secondary | ICD-10-CM

## 2018-07-10 DIAGNOSIS — R062 Wheezing: Secondary | ICD-10-CM

## 2018-07-10 MED ORDER — BENZONATATE 200 MG PO CAPS: 200.0000 mg | ORAL_CAPSULE | Freq: Three times a day (TID) | ORAL | 0 refills | Status: AC | PRN

## 2018-07-10 MED ORDER — ALBUTEROL SULFATE (2.5 MG/3ML) 0.083% IN NEBU: 2.5000 mg | INHALATION_SOLUTION | RESPIRATORY_TRACT | Status: AC

## 2018-07-10 MED ORDER — AZITHROMYCIN 250 MG PO TABS: ORAL_TABLET | ORAL | 0 refills | Status: AC

## 2018-07-10 MED ORDER — PREDNISONE 10 MG (21) PO TBPK: ORAL_TABLET | ORAL | 0 refills | Status: AC

## 2018-07-10 MED ORDER — ALBUTEROL SULFATE HFA 108 (90 BASE) MCG/ACT IN AERS: 2.0000 | INHALATION_SPRAY | Freq: Four times a day (QID) | RESPIRATORY_TRACT | 0 refills | Status: DC | PRN

## 2018-07-10 NOTE — Progress Notes (Addendum)
Subjective:  Shawn Ford is a 27 y.o. male who presents for evaluation of URI like symptoms.  Symptoms include headache described as throbbing, post nasal drip, productive cough with yellow colored sputum, sore throat and wheezing.  Onset of symptoms was 4 days ago, and has been gradually worsening since that time.  Treatment to date:  decongestants and OTC cough medicine.  High risk factors for influenza complications:  none. The patient does smoke, but denies history of seasonal allergies.  The following portions of the patient's history were reviewed and updated as appropriate:  allergies, current medications and past medical history.  Constitutional: positive for fatigue, negative for anorexia, chills, fevers and malaise Eyes: negative Ears, nose, mouth, throat, and face: positive for sore throat and postnasal drip, negative for ear drainage, earaches and hoarseness Respiratory: positive for cough, sputum and wheezing, negative for asthma, chronic bronchitis and dyspnea on exertion Cardiovascular: negative Gastrointestinal: negative Neurological: positive for headaches, negative for coordination problems, dizziness, paresthesia and weakness   Objective:  BP 110/72   Pulse (!) 101   Temp 98.1 F (36.7 C)   Wt 155 lb 12.8 oz (70.7 kg)   SpO2 95%   BMI 23.01 kg/m  General appearance: alert, cooperative, fatigued and no distress Head: Normocephalic, without obvious abnormality, atraumatic Eyes: conjunctivae/corneas clear. PERRL, EOM's intact. Fundi benign. Ears: normal TM's and external ear canals both ears Nose: Nares normal. Septum midline. Mucosa normal. No drainage or sinus tenderness. Throat: lips, mucosa, and tongue normal; teeth and gums normal Lungs: rhonchi posterior - RUL, LLL and wheezes posterior - RUL, RLL, LLL, LUL Heart: regular rate and rhythm, S1, S2 normal, no murmur, click, rub or gallop Abdomen: soft, non-tender; bowel sounds normal; no masses,  no  organomegaly Pulses: 2+ and symmetric Skin: Skin color, texture, turgor normal. No rashes or lesions Lymph nodes: cervical and submandibular nodes normal Neurologic: Grossly normal  Oxygen Saturation post-neb: 99%   Assessment:  Acute Upper Respiratory Infection    Plan:   Exam findings, diagnosis etiology and medication use and indications reviewed with patient. Follow- Up and discharge instructions provided. No emergent/urgent issues found on exam. Patient education was provided. Patient verbalized understanding of information provided and agrees with plan of care (POC), all questions answered. The patient is advised to call or return to clinic if condition does not see an improvement in symptoms, or to seek the care of the closest emergency department if condition worsens with the above plan.   1. Acute upper respiratory infection  - azithromycin (ZITHROMAX) 250 MG tablet; Take as directed.  Dispense: 6 tablet; Refill: 0 - benzonatate (TESSALON) 200 MG capsule; Take 1 capsule (200 mg total) by mouth 3 (three) times daily as needed for up to 10 days for cough.  Dispense: 30 capsule; Refill: 0 -Take medication as prescribed. -You need to stop smoking while symptoms persist.  If you continue to smoke, this can worsen your symptoms are caused a delay and symptom improvement. -Ibuprofen or Tylenol for pain, fever, or general discomfort. -Increase fluids. -Purchase over-the-counter Mucinex to help with chest congestion. -Sleep elevated on at least 2 pillows at bedtime to help with cough. -Use a humidifier or vaporizer when at home and during sleep. -Follow-up in the emergency department if you have worsening cough, shortness of breath, or difficulty breathing. -Follow-up in office if your current symptoms do not improve.   2. Wheezing  - albuterol (PROVENTIL HFA;VENTOLIN HFA) 108 (90 Base) MCG/ACT inhaler; Inhale 2 puffs into the lungs  every 6 (six) hours as needed for up to 10 days for  wheezing or shortness of breath.  Dispense: 1 Inhaler; Refill: 0 - predniSONE (STERAPRED UNI-PAK 21 TAB) 10 MG (21) TBPK tablet; Take as directed.  Dispense: 21 tablet; Refill: 0 -Take medication as prescribed. -You need to stop smoking while symptoms persist.  If you continue to smoke, this can worsen your symptoms are caused a delay and symptom improvement. -Ibuprofen or Tylenol for pain, fever, or general discomfort. -Increase fluids. -Purchase over-the-counter Mucinex to help with chest congestion. -Sleep elevated on at least 2 pillows at bedtime to help with cough. -Use a humidifier or vaporizer when at home and during sleep. -Follow-up in the emergency department if you have worsening cough, shortness of breath, or difficulty breathing. -Follow-up in office if your current symptoms do not improve.

## 2018-07-10 NOTE — Patient Instructions (Signed)
Upper Respiratory Infection, Adult -Take medication as prescribed. -You need to stop smoking while symptoms persist.  If you continue to smoke, this can worsen your symptoms are caused a delay and symptom improvement. -Ibuprofen or Tylenol for pain, fever, or general discomfort. -Increase fluids. -Purchase over-the-counter Mucinex to help with chest congestion. -Sleep elevated on at least 2 pillows at bedtime to help with cough. -Use a humidifier or vaporizer when at home and during sleep. -Follow-up in the emergency department if you have worsening cough, shortness of breath, or difficulty breathing. -Follow-up in office if your current symptoms do not improve.  Most upper respiratory infections (URIs) are a viral infection of the air passages leading to the lungs. A URI affects the nose, throat, and upper air passages. The most common type of URI is nasopharyngitis and is typically referred to as "the common cold." URIs run their course and usually go away on their own. Most of the time, a URI does not require medical attention, but sometimes a bacterial infection in the upper airways can follow a viral infection. This is called a secondary infection. Sinus and middle ear infections are common types of secondary upper respiratory infections. Bacterial pneumonia can also complicate a URI. A URI can worsen asthma and chronic obstructive pulmonary disease (COPD). Sometimes, these complications can require emergency medical care and may be life threatening. What are the causes? Almost all URIs are caused by viruses. A virus is a type of germ and can spread from one person to another. What increases the risk? You may be at risk for a URI if:  You smoke.  You have chronic heart or lung disease.  You have a weakened defense (immune) system.  You are very young or very old.  You have nasal allergies or asthma.  You work in crowded or poorly ventilated areas.  You work in health care  facilities or schools.  What are the signs or symptoms? Symptoms typically develop 2-3 days after you come in contact with a cold virus. Most viral URIs last 7-10 days. However, viral URIs from the influenza virus (flu virus) can last 14-18 days and are typically more severe. Symptoms may include:  Runny or stuffy (congested) nose.  Sneezing.  Cough.  Sore throat.  Headache.  Fatigue.  Fever.  Loss of appetite.  Pain in your forehead, behind your eyes, and over your cheekbones (sinus pain).  Muscle aches.  How is this diagnosed? Your health care provider may diagnose a URI by:  Physical exam.  Tests to check that your symptoms are not due to another condition such as: ? Strep throat. ? Sinusitis. ? Pneumonia. ? Asthma.  How is this treated? A URI goes away on its own with time. It cannot be cured with medicines, but medicines may be prescribed or recommended to relieve symptoms. Medicines may help:  Reduce your fever.  Reduce your cough.  Relieve nasal congestion.  Follow these instructions at home:  Take medicines only as directed by your health care provider.  Gargle warm saltwater or take cough drops to comfort your throat as directed by your health care provider.  Use a warm mist humidifier or inhale steam from a shower to increase air moisture. This may make it easier to breathe.  Drink enough fluid to keep your urine clear or pale yellow.  Eat soups and other clear broths and maintain good nutrition.  Rest as needed.  Return to work when your temperature has returned to normal or as your  health care provider advises. You may need to stay home longer to avoid infecting others. You can also use a face mask and careful hand washing to prevent spread of the virus.  Increase the usage of your inhaler if you have asthma.  Do not use any tobacco products, including cigarettes, chewing tobacco, or electronic cigarettes. If you need help quitting, ask your  health care provider. How is this prevented? The best way to protect yourself from getting a cold is to practice good hygiene.  Avoid oral or hand contact with people with cold symptoms.  Wash your hands often if contact occurs.  There is no clear evidence that vitamin C, vitamin E, echinacea, or exercise reduces the chance of developing a cold. However, it is always recommended to get plenty of rest, exercise, and practice good nutrition. Contact a health care provider if:  You are getting worse rather than better.  Your symptoms are not controlled by medicine.  You have chills.  You have worsening shortness of breath.  You have brown or red mucus.  You have yellow or brown nasal discharge.  You have pain in your face, especially when you bend forward.  You have a fever.  You have swollen neck glands.  You have pain while swallowing.  You have white areas in the back of your throat. Get help right away if:  You have severe or persistent: ? Headache. ? Ear pain. ? Sinus pain. ? Chest pain.  You have chronic lung disease and any of the following: ? Wheezing. ? Prolonged cough. ? Coughing up blood. ? A change in your usual mucus.  You have a stiff neck.  You have changes in your: ? Vision. ? Hearing. ? Thinking. ? Mood. This information is not intended to replace advice given to you by your health care provider. Make sure you discuss any questions you have with your health care provider. Document Released: 01/31/2001 Document Revised: 04/09/2016 Document Reviewed: 11/12/2013 Elsevier Interactive Patient Education  2018 [employer]  Bronchospasm, Adult Bronchospasm is a tightening of the airways going into the lungs. During an episode, it may be harder to breathe. You may cough, and you may make a whistling sound when you breathe (wheeze). This condition often affects people with asthma. What are the causes? This condition is caused by swelling and  irritation in the airways. It can be triggered by:  An infection (common).  Seasonal allergies.  An allergic reaction.  Exercise.  Irritants. These include pollution, cigarette smoke, strong odors, aerosol sprays, and paint fumes.  Weather changes. Winds increase molds and pollens in the air. Cold air may cause swelling.  Stress and emotional upset.  What are the signs or symptoms? Symptoms of this condition include:  Wheezing. If the episode was triggered by an allergy, wheezing may start right away or hours later.  Nighttime coughing.  Frequent or severe coughing with a simple cold.  Chest tightness.  Shortness of breath.  Decreased ability to exercise.  How is this diagnosed? This condition is usually diagnosed with a review of your medical history and a physical exam. Tests, such as lung function tests, are sometimes done to look for other conditions. The need for a chest X-ray depends on where the wheezing occurs and whether it is the first time you have wheezed. How is this treated? This condition may be treated with:  Inhaled medicines. These open up the airways and help you breathe. They can be taken with an inhaler or a  nebulizer device.  Corticosteroid medicines. These may be given for severe bronchospasm, usually when it is associated with asthma.  Avoiding triggers, such as irritants, infection, or allergies.  Follow these instructions at home: Medicines  Take over-the-counter and prescription medicines only as told by your health care provider.  If you need to use an inhaler or nebulizer to take your medicine, ask your health care provider to explain how to use it correctly. If you were given a spacer, always use it with your inhaler. Lifestyle  Reduce the number of triggers in your home. To do this: ? Change your heating and air conditioning filter at least once a month. ? Limit your use of fireplaces and wood stoves. ? Do not smoke. Do not allow  smoking in your home. ? Avoid using perfumes and fragrances. ? Get rid of pests, such as roaches and mice, and their droppings. ? Remove any mold from your home. ? Keep your house clean and dust free. Use unscented cleaning products. ? Replace carpet with wood, tile, or vinyl flooring. Carpet can trap dander and dust. ? Use allergy-proof pillows, mattress covers, and box spring covers. ? Wash bed sheets and blankets every week in hot water. Dry them in a dryer. ? Use blankets that are made of polyester or cotton. ? Wash your hands often. ? Do not allow pets in your bedroom.  Avoid breathing in cold air when you exercise. General instructions  Have a plan for seeking medical care. Know when to call your health care provider and local emergency services, and where to get emergency care.  Stay up to date on your immunizations.  When you have an episode of bronchospasm, stay calm. Try to relax and breathe more slowly.  If you have asthma, make sure you have an asthma action plan.  Keep all follow-up visits as told by your health care provider. This is important. Contact a health care provider if:  You have muscle aches.  You have chest pain.  The mucus that you cough up (sputum) changes from clear or white to yellow, green, gray, or bloody.  You have a fever.  Your sputum gets thicker. Get help right away if:  Your wheezing and coughing get worse, even after you take your prescribed medicines.  It gets even harder to breathe.  You develop severe chest pain. Summary  Bronchospasm is a tightening of the airways going into the lungs.  During an episode of bronchospasm, you may have a harder time breathing. You may cough and make a whistling sound when you breathe (wheeze).  Avoid exposure to triggers such as smoke, dust, mold, animal dander, and fragrances.  When you have an episode of bronchospasm, stay calm. Try to relax and breathe more slowly. This information is not  intended to replace advice given to you by your health care provider. Make sure you discuss any questions you have with your health care provider. Document Released: 08/10/2003 Document Revised: 08/03/2016 Document Reviewed: 08/03/2016 Elsevier Interactive Patient Education  2017 [employer].

## 2018-07-24 ENCOUNTER — Ambulatory Visit: Payer: 59 | Admitting: Psychiatry

## 2018-07-30 ENCOUNTER — Encounter: Payer: Self-pay | Admitting: Physician Assistant

## 2018-07-30 ENCOUNTER — Ambulatory Visit (INDEPENDENT_AMBULATORY_CARE_PROVIDER_SITE_OTHER): Payer: Self-pay | Admitting: Physician Assistant

## 2018-07-30 VITALS — BP 130/75 | HR 112 | Temp 98.1°F | Resp 18 | Wt 156.6 lb

## 2018-07-30 DIAGNOSIS — R062 Wheezing: Secondary | ICD-10-CM

## 2018-07-30 LAB — POC INFLUENZA A&B (BINAX/QUICKVUE)
INFLUENZA A, POC: NEGATIVE
Influenza B, POC: NEGATIVE

## 2018-07-30 MED ORDER — ALBUTEROL SULFATE (2.5 MG/3ML) 0.083% IN NEBU: 2.5000 mg | INHALATION_SOLUTION | Freq: Once | RESPIRATORY_TRACT | Status: DC

## 2018-07-30 MED ORDER — IPRATROPIUM BROMIDE 0.02 % IN SOLN: 0.5000 mg | Freq: Once | RESPIRATORY_TRACT | Status: DC

## 2018-07-30 NOTE — Patient Instructions (Addendum)
-Please go to urgent care for further evaluation and management of symptoms. -Thank you for letting me participate in your health and well being.  Bronchospasm, Adult Bronchospasm is a tightening of the airways going into the lungs. During an episode, it may be harder to breathe. You may cough, and you may make a whistling sound when you breathe (wheeze). This condition often affects people with asthma. What are the causes? This condition is caused by swelling and irritation in the airways. It can be triggered by:  An infection (common).  Seasonal allergies.  An allergic reaction.  Exercise.  Irritants. These include pollution, cigarette smoke, strong odors, aerosol sprays, and paint fumes.  Weather changes. Winds increase molds and pollens in the air. Cold air may cause swelling.  Stress and emotional upset.  What are the signs or symptoms? Symptoms of this condition include:  Wheezing. If the episode was triggered by an allergy, wheezing may start right away or hours later.  Nighttime coughing.  Frequent or severe coughing with a simple cold.  Chest tightness.  Shortness of breath.  Decreased ability to exercise.  How is this diagnosed? This condition is usually diagnosed with a review of your medical history and a physical exam. Tests, such as lung function tests, are sometimes done to look for other conditions. The need for a chest X-ray depends on where the wheezing occurs and whether it is the first time you have wheezed. How is this treated? This condition may be treated with:  Inhaled medicines. These open up the airways and help you breathe. They can be taken with an inhaler or a nebulizer device.  Corticosteroid medicines. These may be given for severe bronchospasm, usually when it is associated with asthma.  Avoiding triggers, such as irritants, infection, or allergies.  Follow these instructions at home: Medicines  Take over-the-counter and prescription  medicines only as told by your health care provider.  If you need to use an inhaler or nebulizer to take your medicine, ask your health care provider to explain how to use it correctly. If you were given a spacer, always use it with your inhaler. Lifestyle  Reduce the number of triggers in your home. To do this: ? Change your heating and air conditioning filter at least once a month. ? Limit your use of fireplaces and wood stoves. ? Do not smoke. Do not allow smoking in your home. ? Avoid using perfumes and fragrances. ? Get rid of pests, such as roaches and mice, and their droppings. ? Remove any mold from your home. ? Keep your house clean and dust free. Use unscented cleaning products. ? Replace carpet with wood, tile, or vinyl flooring. Carpet can trap dander and dust. ? Use allergy-proof pillows, mattress covers, and box spring covers. ? Wash bed sheets and blankets every week in hot water. Dry them in a dryer. ? Use blankets that are made of polyester or cotton. ? Wash your hands often. ? Do not allow pets in your bedroom.  Avoid breathing in cold air when you exercise. General instructions  Have a plan for seeking medical care. Know when to call your health care provider and local emergency services, and where to get emergency care.  Stay up to date on your immunizations.  When you have an episode of bronchospasm, stay calm. Try to relax and breathe more slowly.  If you have asthma, make sure you have an asthma action plan.  Keep all follow-up visits as told by your health care  provider. This is important. Contact a health care provider if:  You have muscle aches.  You have chest pain.  The mucus that you cough up (sputum) changes from clear or white to yellow, green, gray, or bloody.  You have a fever.  Your sputum gets thicker. Get help right away if:  Your wheezing and coughing get worse, even after you take your prescribed medicines.  It gets even harder to  breathe.  You develop severe chest pain. Summary  Bronchospasm is a tightening of the airways going into the lungs.  During an episode of bronchospasm, you may have a harder time breathing. You may cough and make a whistling sound when you breathe (wheeze).  Avoid exposure to triggers such as smoke, dust, mold, animal dander, and fragrances.  When you have an episode of bronchospasm, stay calm. Try to relax and breathe more slowly. This information is not intended to replace advice given to you by your health care provider. Make sure you discuss any questions you have with your health care provider. Document Released: 08/10/2003 Document Revised: 08/03/2016 Document Reviewed: 08/03/2016 Elsevier Interactive Patient Education  2017 [employer].

## 2018-07-30 NOTE — Progress Notes (Addendum)
MRN: [id number] DOB: March 06, 1991  Subjective:   Shawn Ford is a 27 y.o. male presenting for chief complaint of Nasal Congestion (X 2 DAYS, CHEST CONGESTION ); Breathing Problem (X 2 DAYS); Chills (X 2 DAYS); and Fatigue (X 2 DAYS) .  Reports 2 day history of sudden onset  nasal congestion, scratchy throat, b/l ear fullness, rhinorrhea, sinus pressure, body aches, chest congestion, cough, and wheezing. Has associated chest tightness and SOB when trying to take deep breaths. Denies fever, exertional chest pain, lower leg swelling, diaphoresis, heart palpitations, vomiting, diarrhea, abdominal pain, ear pain, sinus pain, and itchy watery eyes. Has tried robitussin and mucinex with no full relief.Unsure if he has had sick contact exposure. Has history of seasonal allergies, no history of asthma or COPD. Patient has had flu shot this season. Current every day smoker, smokes 1 pack every 2-4 days.  Last seen at [facility] on 11/20 and dx with URI with bronchospasm. Given breathing tx, abx, steroid taper, and albuterol inhaler.  Please review that note for additional details. Pt reports responding well to that treatment. Reports his albuterol inhaler he was given Rx for is empty. Used it while he was sick? No recent trauma/surgery/travel/immobilization, hx of cancer, leg swelling, hemoptysis, prior DVT/PE, or hormone use. Denies ilicit drug use. FH of "some type of heart disease" in father at unknown age. Denies any other aggravating or relieving factors, no other questions or concerns.  Shawn Ford has a current medication list which includes the following prescription(s): dextromethorphan-guaifenesin, pseudoephedrine, albuterol, benzonatate, olanzapine, oxycodone, and vilazodone hcl, and the following Facility-Administered Medications: albuterol and ipratropium. Also has No Known Allergies.  Shawn Ford  has a past medical history of Bipolar 1 disorder (HCC). Also  has a past surgical history that includes Tympanostomy  tube placement (Bilateral, 1995) and ORIF radial fracture (Left, 01/24/2017).   Objective:   Vitals: BP 130/75 (BP Location: Right Arm, Patient Position: Sitting, Cuff Size: Normal)   Pulse (!) 112   Temp 98.1 F (36.7 C) (Oral)   Resp 18   Wt 156 lb 9.6 oz (71 kg)   SpO2 95%   BMI 23.13 kg/m   Physical Exam  Constitutional: He is oriented to person, place, and time. He appears well-developed and well-nourished. He does not have a sickly appearance. He does not appear ill. No distress.  HENT:  Head: Normocephalic and atraumatic.  Right Ear: Tympanic membrane, external ear and ear canal normal.  Left Ear: External ear and ear canal normal. There is drainage (dark brown cerumen blocking view of TM).  Nose: Mucosal edema and rhinorrhea present. Right sinus exhibits no maxillary sinus tenderness and no frontal sinus tenderness. Left sinus exhibits no maxillary sinus tenderness and no frontal sinus tenderness.  Mouth/Throat: Uvula is midline and mucous membranes are normal. No trismus in the jaw. Posterior oropharyngeal erythema present. No posterior oropharyngeal edema or tonsillar abscesses. Tonsils are 1+ on the right. Tonsils are 1+ on the left. No tonsillar exudate.  Eyes: Conjunctivae are normal.  Neck: Normal range of motion.  Cardiovascular: Normal rate, regular rhythm, normal heart sounds and intact distal pulses.  Pulmonary/Chest: Effort normal. No accessory muscle usage or stridor. No tachypnea. No respiratory distress. He has no decreased breath sounds. He has wheezes (diffuse wheezes noted in bilateral lung fields ). He has no rhonchi. He has no rales.  Lymphadenopathy:       Head (right side): No submental, no submandibular, no tonsillar, no preauricular, no posterior auricular and no occipital adenopathy present.  Head (left side): No submental, no submandibular, no tonsillar, no preauricular, no posterior auricular and no occipital adenopathy present.    He has no cervical  adenopathy.       Right: No supraclavicular adenopathy present.       Left: No supraclavicular adenopathy present.  Neurological: He is alert and oriented to person, place, and time.  Skin: Skin is warm and dry.  Psychiatric: He has a normal mood and affect.  Vitals reviewed.   Results for orders placed or performed in visit on 07/30/18 (from the past 24 hour(s))  POC Influenza A&B(BINAX/QUICKVUE)     Status: Normal   Collection Time: 07/30/18  1:49 PM  Result Value Ref Range   Influenza A, POC Negative Negative   Influenza B, POC Negative Negative   Breathing treatment of albuterol and atrovent given in office. Patient reports feeling maybe 5% better. Diffuse wheezes and rhonchi auscultated on lung exam. SpO2 increased from 94% pre breathing treatment to 95% post breathing treatment.    Assessment and Plan :  1. Wheezing No improvement in wheezing with one duoneb, pt reports feeling ~5% better after breathing tx in terms of SOB and chest tightness. Recommended 2nd duoneb tx in office. Pt declined stating he will just go to urgent care for further evaluation and management.  - ipratropium (ATROVENT) nebulizer solution 0.5 mg - albuterol (PROVENTIL) (2.5 MG/3ML) 0.083% nebulizer solution 2.5 mg - POC Influenza A&B(BINAX/QUICKVUE)  Side effects, risks, benefits, and alternatives of the treatment plan prescribed today were discussed, and patient expressed understanding of the instructions given. No barriers to understanding were identified. Red flags discussed in detail. Pt expressed understanding regarding what to do in case of emergency/urgent symptoms.  Benjiman CoreBrittany Yatzil Clippinger, PA-C  [facility] 07/30/2018 2:16 PM

## 2018-07-31 ENCOUNTER — Telehealth: Payer: Self-pay

## 2018-07-31 DIAGNOSIS — R062 Wheezing: Secondary | ICD-10-CM | POA: Diagnosis not present

## 2018-07-31 DIAGNOSIS — R05 Cough: Secondary | ICD-10-CM | POA: Diagnosis not present

## 2018-07-31 DIAGNOSIS — J40 Bronchitis, not specified as acute or chronic: Secondary | ICD-10-CM | POA: Diagnosis not present

## 2018-07-31 DIAGNOSIS — R0602 Shortness of breath: Secondary | ICD-10-CM | POA: Diagnosis not present

## 2018-07-31 NOTE — Telephone Encounter (Signed)
I called the patient to follow up, he states he feels better and he went this morning to the Urgent Care, because the provider told him to go to the urgent care as he needs and Xrays

## 2018-08-01 ENCOUNTER — Encounter: Payer: Self-pay | Admitting: Psychiatry

## 2018-08-01 ENCOUNTER — Ambulatory Visit: Payer: 59 | Admitting: Psychiatry

## 2018-08-01 DIAGNOSIS — F3181 Bipolar II disorder: Secondary | ICD-10-CM

## 2018-08-01 DIAGNOSIS — F4310 Post-traumatic stress disorder, unspecified: Secondary | ICD-10-CM

## 2018-08-01 MED ORDER — OLANZAPINE 10 MG PO TABS: 10.0000 mg | ORAL_TABLET | Freq: Every day | ORAL | 1 refills | Status: DC
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
## 2018-08-28 ENCOUNTER — Ambulatory Visit: Payer: 59 | Admitting: Psychology

## 2018-09-16 ENCOUNTER — Ambulatory Visit (INDEPENDENT_AMBULATORY_CARE_PROVIDER_SITE_OTHER): Payer: 59 | Admitting: Psychology

## 2018-09-16 DIAGNOSIS — F439 Reaction to severe stress, unspecified: Secondary | ICD-10-CM

## 2018-09-16 DIAGNOSIS — F319 Bipolar disorder, unspecified: Secondary | ICD-10-CM

## 2018-10-03 ENCOUNTER — Ambulatory Visit: Payer: 59 | Admitting: Psychiatry

## 2018-10-22 ENCOUNTER — Ambulatory Visit (INDEPENDENT_AMBULATORY_CARE_PROVIDER_SITE_OTHER): Payer: 59 | Admitting: Psychology

## 2018-10-22 DIAGNOSIS — F439 Reaction to severe stress, unspecified: Secondary | ICD-10-CM | POA: Diagnosis not present

## 2018-10-22 DIAGNOSIS — F319 Bipolar disorder, unspecified: Secondary | ICD-10-CM | POA: Diagnosis not present

## 2018-11-04 ENCOUNTER — Ambulatory Visit (INDEPENDENT_AMBULATORY_CARE_PROVIDER_SITE_OTHER): Payer: 59 | Admitting: Psychology

## 2018-11-04 DIAGNOSIS — F319 Bipolar disorder, unspecified: Secondary | ICD-10-CM | POA: Diagnosis not present

## 2018-11-04 DIAGNOSIS — F439 Reaction to severe stress, unspecified: Secondary | ICD-10-CM | POA: Diagnosis not present

## 2018-12-19 ENCOUNTER — Ambulatory Visit (INDEPENDENT_AMBULATORY_CARE_PROVIDER_SITE_OTHER): Payer: 59 | Admitting: Psychology

## 2018-12-19 DIAGNOSIS — F439 Reaction to severe stress, unspecified: Secondary | ICD-10-CM | POA: Diagnosis not present

## 2018-12-19 DIAGNOSIS — F319 Bipolar disorder, unspecified: Secondary | ICD-10-CM

## 2019-01-24 ENCOUNTER — Other Ambulatory Visit: Payer: Self-pay

## 2019-01-24 MED ORDER — OLANZAPINE 10 MG PO TABS: 10.0000 mg | ORAL_TABLET | Freq: Every day | ORAL | 0 refills | Status: DC

## 2019-01-28 ENCOUNTER — Ambulatory Visit (INDEPENDENT_AMBULATORY_CARE_PROVIDER_SITE_OTHER): Payer: 59 | Admitting: Psychology

## 2019-01-28 DIAGNOSIS — F319 Bipolar disorder, unspecified: Secondary | ICD-10-CM | POA: Diagnosis not present

## 2019-01-28 DIAGNOSIS — F439 Reaction to severe stress, unspecified: Secondary | ICD-10-CM | POA: Diagnosis not present
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
## 2019-02-13 ENCOUNTER — Encounter: Payer: Self-pay | Admitting: Psychiatry

## 2019-02-13 ENCOUNTER — Other Ambulatory Visit: Payer: Self-pay

## 2019-02-13 ENCOUNTER — Ambulatory Visit (INDEPENDENT_AMBULATORY_CARE_PROVIDER_SITE_OTHER): Payer: 59 | Admitting: Psychiatry

## 2019-02-13 DIAGNOSIS — F4310 Post-traumatic stress disorder, unspecified: Secondary | ICD-10-CM | POA: Diagnosis not present

## 2019-02-13 DIAGNOSIS — F3181 Bipolar II disorder: Secondary | ICD-10-CM

## 2019-02-13 MED ORDER — OLANZAPINE 10 MG PO TABS: 10.0000 mg | ORAL_TABLET | Freq: Every day | ORAL | 0 refills | Status: DC

## 2019-02-13 MED ORDER — PROPRANOLOL HCL 20 MG PO TABS: 20.0000 mg | ORAL_TABLET | Freq: Two times a day (BID) | ORAL | 1 refills | Status: DC | PRN
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
## 2019-03-06 ENCOUNTER — Ambulatory Visit (INDEPENDENT_AMBULATORY_CARE_PROVIDER_SITE_OTHER): Payer: 59 | Admitting: Psychology

## 2019-03-06 DIAGNOSIS — F439 Reaction to severe stress, unspecified: Secondary | ICD-10-CM | POA: Diagnosis not present

## 2019-03-06 DIAGNOSIS — F319 Bipolar disorder, unspecified: Secondary | ICD-10-CM | POA: Diagnosis not present

## 2019-04-08 ENCOUNTER — Ambulatory Visit (INDEPENDENT_AMBULATORY_CARE_PROVIDER_SITE_OTHER): Payer: BC Managed Care – PPO | Admitting: Psychology

## 2019-04-08 DIAGNOSIS — F439 Reaction to severe stress, unspecified: Secondary | ICD-10-CM

## 2019-04-08 DIAGNOSIS — F319 Bipolar disorder, unspecified: Secondary | ICD-10-CM | POA: Diagnosis not present

## 2019-06-16 ENCOUNTER — Ambulatory Visit: Payer: 59 | Admitting: Psychiatry

## 2020-01-04 ENCOUNTER — Emergency Department (HOSPITAL_COMMUNITY)
Admission: EM | Admit: 2020-01-04 | Discharge: 2020-01-04 | Disposition: A | Discharge status: Home / Self Care | Payer: Self-pay | Attending: Emergency Medicine | Admitting: Emergency Medicine

## 2020-01-04 ENCOUNTER — Encounter (HOSPITAL_COMMUNITY): Payer: Self-pay | Admitting: Emergency Medicine

## 2020-01-04 ENCOUNTER — Emergency Department (HOSPITAL_COMMUNITY): Payer: Self-pay

## 2020-01-04 ENCOUNTER — Other Ambulatory Visit: Payer: Self-pay

## 2020-01-04 DIAGNOSIS — M25532 Pain in left wrist: Secondary | ICD-10-CM | POA: Diagnosis not present

## 2020-01-04 DIAGNOSIS — R200 Anesthesia of skin: Secondary | ICD-10-CM | POA: Insufficient documentation

## 2020-01-04 DIAGNOSIS — Z8781 Personal history of (healed) traumatic fracture: Secondary | ICD-10-CM | POA: Insufficient documentation

## 2020-01-04 DIAGNOSIS — F17200 Nicotine dependence, unspecified, uncomplicated: Secondary | ICD-10-CM | POA: Insufficient documentation

## 2020-01-04 DIAGNOSIS — Z981 Arthrodesis status: Secondary | ICD-10-CM | POA: Insufficient documentation

## 2020-01-04 NOTE — ED Provider Notes (Signed)
[facility] DEPT Provider Note   CSN: [id number] Arrival date & time: 01/04/20  2101     History Chief Complaint  Patient presents with  . Wrist Pain    Shawn Ford is a 29 y.o. male.  29 yo M with a chief complaints of left wrist pain.  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  The pain extended up into his left chest and down to the left leg.  This lasted for about an hour or so.  Resolved spontaneously just prior to my evaluation.  He has been having chronic pain to that left wrist since he had his injury and had a repair about 3 years ago.  Has not seen his orthopedic surgeon recently.  The history is provided by the patient.  Wrist Pain This is a new problem. The current episode started more than 1 week ago. The problem occurs constantly. The problem has been rapidly worsening. Pertinent negatives include no chest pain, no abdominal pain, no headaches and no shortness of breath. Nothing aggravates the symptoms. Nothing relieves the symptoms. He has tried nothing for the symptoms. The treatment provided no relief.       Past Medical History:  Diagnosis Date  . Bipolar 1 disorder [facility])     Patient Active Problem List   Diagnosis Date Noted  . PTSD (post-traumatic stress disorder) 07/07/2018  . Displaced oblique fracture of shaft of left radius, initial encounter for closed fracture 01/24/2017  . Radial fracture 01/24/2017    Past Surgical History:  Procedure Laterality Date  . ORIF RADIAL FRACTURE Left 01/24/2017   Procedure: OPEN REDUCTION INTERNAL FIXATION (ORIF) RADIAL FRACTURE;  Surgeon: Roseanne Kaufman, MD;  Location: [facility] ORS;  Service: Orthopedics;  Laterality: Left;  . TYMPANOSTOMY TUBE PLACEMENT Bilateral 1995       History reviewed. No pertinent family history.  Social History    Tobacco Use  . Smoking status: Current Every Day Smoker  . Smokeless tobacco: Never Used  Substance Use Topics  . Alcohol use: Yes    Comment: socially  . Drug use: Yes    Types: Marijuana    Home Medications Prior to Admission medications   Medication Sig Start Date End Date Taking? Authorizing Provider  albuterol (PROVENTIL HFA;VENTOLIN HFA) 108 (90 Base) MCG/ACT inhaler Inhale 2 puffs into the lungs every 6 (six) hours as needed for up to 10 days for wheezing or shortness of breath. 07/10/18 07/20/18  Kara Dies, NP  benzonatate (TESSALON) 100 MG capsule Take 1 capsule (100 mg total) by mouth every 8 (eight) hours. Patient not taking: Reported on 01/24/2017 11/07/15   Varney Biles, MD  dextromethorphan-guaiFENesin [facility] DM) 30-600 MG 12hr tablet Take 1 tablet by mouth 2 (two) times daily.    [provider]  OLANZapine (ZYPREXA) 10 MG tablet Take 1 tablet (10 mg total) by mouth at bedtime. 02/13/19   Cottle, Billey Co., MD  oxyCODONE (OXY IR/ROXICODONE) 5 MG immediate release tablet Take 1-2 tablets (5-10 mg total) by mouth every 4 (four) hours as needed for moderate pain. Patient not taking: Reported on 07/30/2018 01/25/17   Avelina Laine, PA-C  propranolol (INDERAL) 20 MG tablet Take 1-2 tablets (20-40 mg total) by mouth 2 (two) times daily as needed (anxiety). 02/13/19   Cottle, Billey Co., MD  pseudoephedrine (SUDAFED)  60 MG tablet Take 60 mg by mouth every 4 (four) hours as needed for congestion.    [provider]    Allergies    Patient has no known allergies.  Review of Systems   Review of Systems  Constitutional: Negative for chills and fever.  HENT: Negative for congestion and facial swelling.   Eyes: Negative for discharge and visual disturbance.  Respiratory: Negative for shortness of breath.   Cardiovascular: Negative for chest pain and palpitations.  Gastrointestinal: Negative for abdominal pain, diarrhea and vomiting.   Musculoskeletal: Negative for arthralgias and myalgias.  Skin: Negative for color change and rash.  Neurological: Negative for tremors, syncope and headaches.  Psychiatric/Behavioral: Negative for confusion and dysphoric mood.    Physical Exam Updated Vital Signs BP (!) 147/92 (BP Location: Right Arm)   Pulse (!) 107   Temp 98.4 F (36.9 C) (Oral)   Resp 16   Ht 5\' 9"  (1.753 m)   Wt 68 kg   SpO2 100%   BMI 22.15 kg/m   Physical Exam Vitals and nursing note reviewed.  Constitutional:      Appearance: He is well-developed.  HENT:     Head: Normocephalic and atraumatic.  Eyes:     Pupils: Pupils are equal, round, and reactive to light.  Neck:     Vascular: No JVD.  Cardiovascular:     Rate and Rhythm: Normal rate and regular rhythm.     Heart sounds: No murmur. No friction rub. No gallop.   Pulmonary:     Effort: No respiratory distress.     Breath sounds: No wheezing.  Abdominal:     General: There is no distension.     Tenderness: There is no abdominal tenderness. There is no guarding or rebound.  Musculoskeletal:        General: Normal range of motion.     Cervical back: Normal range of motion and neck supple.     Comments: Old incision scar to the volar forearm.  No significant edema pulse motor and sensation are intact distally.  Able to pronate and supinate without issue.  Full range of motion of the elbow.  Skin:    Coloration: Skin is not pale.     Findings: No rash.  Neurological:     Mental Status: He is alert and oriented to person, place, and time.  Psychiatric:        Behavior: Behavior normal.     ED Results / Procedures / Treatments   Labs (all labs ordered are listed, but only abnormal results are displayed) Labs Reviewed - No data to display  EKG None  Radiology DG Forearm Left  Result Date: 01/04/2020 CLINICAL DATA:  Left forearm pain EXAM: LEFT FOREARM - 2 VIEW COMPARISON:  None. FINDINGS: There is no evidence of fracture or other focal  bone lesions. The patient is status post ORIF with plate screw fixation of the distal ulna. IMPRESSION: Negative. Electronically Signed   By: 01/06/2020 M.D.   On: 01/04/2020 22:19    Procedures Procedures (including critical care time)  Medications Ordered in ED Medications - No data to display  ED Course  I have reviewed the triage vital signs and the nursing notes.  Pertinent labs & imaging results that were available during my care of the patient were reviewed by me and considered in my medical decision making (see chart for details).    MDM Rules/Calculators/A&P  29 yo M with a chief complaint of left wrist pain.  He told me that this pain got suddenly much worse when he felt the screws move in his wrist and then he felt the pain shoot to his left chest and down his left leg.  This resolved prior to my exam.  He describes some severe numbness to the hand where he can feel anything.  I am unable to come up with a medical reason for his symptoms.  Plain film of his left forearm was obtained that does not show any malfunction of the hardware.  As he had chronic issues with this will have him follow-up with his orthopedic surgeon.  10:42 PM:  I have discussed the diagnosis/risks/treatment options with the patient and believe the pt to be eligible for discharge home to follow-up with PCP. We also discussed returning to the ED immediately if new or worsening sx occur. We discussed the sx which are most concerning (e.g., sudden worsening pain, fever, inability to tolerate by mouth) that necessitate immediate return. Medications administered to the patient during their visit and any new prescriptions provided to the patient are listed below.  Medications given during this visit Medications - No data to display   The patient appears reasonably screen and/or stabilized for discharge and I doubt any other medical condition or other [facility] requiring further screening,  evaluation, or treatment in the ED at this time prior to discharge.   Final Clinical Impression(s) / ED Diagnoses Final diagnoses:  Left wrist pain    Rx / DC Orders ED Discharge Orders    None       Melene Plan, DO 01/04/20 2242

## 2020-01-04 NOTE — ED Triage Notes (Signed)
Pt reports having pain in left wrist after having wrist surgery 3 years ago. Pt states that he has been having increasing numbness.

## 2020-01-04 NOTE — ED Notes (Signed)
Pt verbalizes understanding of DC instructions. Pt belongings returned and is ambulatory out of ED.  

## 2020-01-04 NOTE — Discharge Instructions (Signed)
Your x-ray here today does not show any hardware malfunction.  Take 4 over the counter ibuprofen tablets 3 times a day or 2 over-the-counter naproxen tablets twice a day for pain. Also take tylenol 1000mg (2 extra strength) four times a day.    Follow-up with your orthopedic surgeon in the office and discussed with him that you have been having some continued pain and worsening.

## 2020-01-12 ENCOUNTER — Telehealth: Payer: Self-pay | Admitting: Psychiatry

## 2020-01-12 NOTE — Telephone Encounter (Signed)
I have no choice but to see him yearly to RX meds.  Medical standard of care.  I will not RX med until he schedules appt and if he doesn't keep appt, I wont renew it.

## 2020-01-12 NOTE — Telephone Encounter (Signed)
Pt is requesting Olanzepine refill. I informed him he would need an appt or at least to schedule. He has no insur and doesn't want to pay for visit since he is doing ok.

## 2020-01-14 NOTE — Telephone Encounter (Signed)
Pt was notified he would need to schedule an appt to get his refill. He acknowledged and said he would call back to schedule.

## 2021-01-24 ENCOUNTER — Emergency Department (HOSPITAL_COMMUNITY)
Admission: EM | Admit: 2021-01-24 | Discharge: 2021-01-24 | Disposition: A | Discharge status: Home / Self Care | Payer: Self-pay | Attending: Emergency Medicine | Admitting: Emergency Medicine

## 2021-01-24 ENCOUNTER — Other Ambulatory Visit: Payer: Self-pay

## 2021-01-24 ENCOUNTER — Other Ambulatory Visit (HOSPITAL_COMMUNITY): Payer: Self-pay

## 2021-01-24 ENCOUNTER — Encounter (HOSPITAL_COMMUNITY): Payer: Self-pay

## 2021-01-24 DIAGNOSIS — W540XXA Bitten by dog, initial encounter: Secondary | ICD-10-CM

## 2021-01-24 DIAGNOSIS — S01511A Laceration without foreign body of lip, initial encounter: Secondary | ICD-10-CM | POA: Diagnosis not present

## 2021-01-24 DIAGNOSIS — Z23 Encounter for immunization: Secondary | ICD-10-CM | POA: Insufficient documentation

## 2021-01-24 DIAGNOSIS — S01551A Open bite of lip, initial encounter: Secondary | ICD-10-CM | POA: Diagnosis not present

## 2021-01-24 DIAGNOSIS — F17200 Nicotine dependence, unspecified, uncomplicated: Secondary | ICD-10-CM | POA: Insufficient documentation

## 2021-01-24 MED ORDER — TETANUS-DIPHTH-ACELL PERTUSSIS 5-2.5-18.5 LF-MCG/0.5 IM SUSY
0.5000 mL | PREFILLED_SYRINGE | Freq: Once | INTRAMUSCULAR | Status: AC
  Administered 2021-01-24: 0.5 mL via INTRAMUSCULAR
  Filled 2021-01-24: qty 0.5

## 2021-01-24 MED ORDER — AMOXICILLIN-POT CLAVULANATE 875-125 MG PO TABS: 1.0000 | ORAL_TABLET | Freq: Two times a day (BID) | ORAL | 0 refills | Status: DC

## 2021-01-24 MED ORDER — AMOXICILLIN-POT CLAVULANATE 875-125 MG PO TABS
1.0000 | ORAL_TABLET | Freq: Once | ORAL | Status: AC
  Administered 2021-01-24: 1 via ORAL
  Filled 2021-01-24: qty 1

## 2021-01-24 MED ORDER — AMOXICILLIN-POT CLAVULANATE 875-125 MG PO TABS
1.0000 | ORAL_TABLET | Freq: Two times a day (BID) | ORAL | 0 refills | Status: DC
  Filled 2021-01-24: qty 14, 7d supply, fill #0

## 2021-01-24 NOTE — ED Provider Notes (Signed)
[facility]-EMERGENCY DEPT Provider Note   CSN: [id number] Arrival date & time: 01/24/21  0342     History Chief Complaint  Patient presents with  . Animal Bite    Shawn Ford is a 30 y.o. male.  The history is provided by the patient and medical records.  Animal Bite   30 y.o. M with hx bipolar disorder, presenting to the ED following animal bite.  Patient states his roommate dog got agitated and lunged at him, bit down on his lip.  He has small laceration to the right lower lip, puncture wound internally, and puncture wound to left chin.  Bleeding is controlled on arrival.  Tetanus is not up-to-date.  Dog is UTD on rabies vaccines (roommate confirmed via phone).  Past Medical History:  Diagnosis Date  . Bipolar 1 disorder [facility])     Patient Active Problem List   Diagnosis Date Noted  . PTSD (post-traumatic stress disorder) 07/07/2018  . Displaced oblique fracture of shaft of left radius, initial encounter for closed fracture 01/24/2017  . Radial fracture 01/24/2017    Past Surgical History:  Procedure Laterality Date  . ORIF RADIAL FRACTURE Left 01/24/2017   Procedure: OPEN REDUCTION INTERNAL FIXATION (ORIF) RADIAL FRACTURE;  Surgeon: Dominica Severin, MD;  Location: [facility] ORS;  Service: Orthopedics;  Laterality: Left;  . TYMPANOSTOMY TUBE PLACEMENT Bilateral 1995       No family history on file.  Social History   Tobacco Use  . Smoking status: Current Every Day Smoker  . Smokeless tobacco: Never Used  Substance Use Topics  . Alcohol use: Yes    Comment: socially  . Drug use: Yes    Types: Marijuana    Home Medications Prior to Admission medications   Medication Sig Start Date End Date Taking? Authorizing Provider  albuterol (PROVENTIL HFA;VENTOLIN HFA) 108 (90 Base) MCG/ACT inhaler Inhale 2 puffs into the lungs every 6 (six) hours as needed for up to 10 days for wheezing or shortness of breath. 07/10/18 07/20/18  Benay Pike,  NP  benzonatate (TESSALON) 100 MG capsule Take 1 capsule (100 mg total) by mouth every 8 (eight) hours. Patient not taking: Reported on 01/24/2017 11/07/15   Derwood Kaplan, MD  dextromethorphan-guaiFENesin [facility] DM) 30-600 MG 12hr tablet Take 1 tablet by mouth 2 (two) times daily.    [provider]  OLANZapine (ZYPREXA) 10 MG tablet Take 1 tablet (10 mg total) by mouth at bedtime. 02/13/19   Cottle, Steva Ready., MD  oxyCODONE (OXY IR/ROXICODONE) 5 MG immediate release tablet Take 1-2 tablets (5-10 mg total) by mouth every 4 (four) hours as needed for moderate pain. Patient not taking: Reported on 07/30/2018 01/25/17   Karie Chimera, PA-C  propranolol (INDERAL) 20 MG tablet Take 1-2 tablets (20-40 mg total) by mouth 2 (two) times daily as needed (anxiety). 02/13/19   Cottle, Steva Ready., MD  pseudoephedrine (SUDAFED) 60 MG tablet Take 60 mg by mouth every 4 (four) hours as needed for congestion.    [provider]    Allergies    Patient has no known allergies.  Review of Systems   Review of Systems  Skin: Positive for wound.  All other systems reviewed and are negative.   Physical Exam Updated Vital Signs BP (!) 152/87 (BP Location: Right Arm)   Pulse 92   Temp 98.2 F (36.8 C) (Oral)   Resp 16   Ht 5\' 10"  (1.778 m)   Wt 72.6 kg   SpO2  100%   BMI 22.96 kg/m   Physical Exam Vitals and nursing note reviewed.  Constitutional:      Appearance: He is well-developed.  HENT:     Head: Normocephalic and atraumatic.     Mouth/Throat:      Comments: Small 0.5cm laceration noted to lower portion of right lower lip that is not full thickness, mild gaping of tissue without active bleeding Puncture wound noted to the inner lip, no bleeding, is not full thickness Puncture wound noted to left chin, no bleeding Eyes:     Conjunctiva/sclera: Conjunctivae normal.     Pupils: Pupils are equal, round, and reactive to light.  Cardiovascular:     Rate and Rhythm: Normal  rate and regular rhythm.     Heart sounds: Normal heart sounds.  Pulmonary:     Effort: Pulmonary effort is normal.     Breath sounds: Normal breath sounds.  Abdominal:     General: Bowel sounds are normal.     Palpations: Abdomen is soft.  Musculoskeletal:        General: Normal range of motion.     Cervical back: Normal range of motion.  Skin:    General: Skin is warm and dry.  Neurological:     Mental Status: He is alert and oriented to person, place, and time.     ED Results / Procedures / Treatments   Labs (all labs ordered are listed, but only abnormal results are displayed) Labs Reviewed - No data to display  EKG None  Radiology No results found.  Procedures Procedures   LACERATION REPAIR Performed by: Garlon Hatchet Authorized by: Garlon Hatchet Consent: Verbal consent obtained. Risks and benefits: risks, benefits and alternatives were discussed Consent given by: patient Patient identity confirmed: provided demographic data Prepped and Draped in normal sterile fashion Wound explored  Laceration Location: right lower lip  Laceration Length: 0.5cm  No Foreign Bodies seen or palpated  Anesthesia: none  Local anesthetic: none  Anesthetic total: 0 ml  Irrigation method: syringe Amount of cleaning: standard  Skin closure: 5-0 vicryl rapide  Number of sutures: 1  Technique: simple interrupted  Patient tolerance: Patient tolerated the procedure well with no immediate complications.   Medications Ordered in ED Medications  amoxicillin-clavulanate (AUGMENTIN) 875-125 MG per tablet 1 tablet (has no administration in time range)  Tdap (BOOSTRIX) injection 0.5 mL (has no administration in time range)    ED Course  I have reviewed the triage vital signs and the nursing notes.  Pertinent labs & imaging results that were available during my care of the patient were reviewed by me and considered in my medical decision making (see chart for  details).    MDM Rules/Calculators/A&P  30 year old male presenting to the ED with dog bite to right lower lip.  This was done by roommates dog who is up-to-date on vaccines (roommate confirmed via phone).  He has 0.5 cm laceration to right lower lip, puncture wound to inner lip, and puncture wound to the left chin.  Bleeding is well controlled.  Tetanus updated, given first dose of Augmentin.  Lip wound was approximated with single stitch, no defect noted.  Advise soft diet over the next 24 to 48 hours, continue home wound care.  Augmentin for the next week.  Can follow-up with PCP.  Return here for new concerns.  Final Clinical Impression(s) / ED Diagnoses Final diagnoses:  Dog bite, initial encounter    Rx / DC Orders ED Discharge Orders  Ordered    amoxicillin-clavulanate (AUGMENTIN) 875-125 MG tablet  Every 12 hours        01/24/21 0423           Garlon Hatchet, PA-C 01/24/21 0433    Palumbo, April, MD 01/24/21 [phone]

## 2021-01-24 NOTE — ED Triage Notes (Signed)
Patient arrived stating his room mates dog bit through his bottom lip, bleeding controlled in triage. States he is unsure if the dog is up to date on his vaccines.

## 2021-01-24 NOTE — Discharge Instructions (Addendum)
Take the prescribed medication as directed.  Make sure to finish all the antibiotics. May want to eat soft diet for the next 24 to 48 hours until oral wounds heal. Follow-up with your primary care doctor. Return to the ED for new or worsening symptoms.

## 2021-03-03 ENCOUNTER — Ambulatory Visit (HOSPITAL_COMMUNITY)
Admission: EM | Admit: 2021-03-03 | Discharge: 2021-03-04 | Disposition: A | Discharge status: Home / Self Care | Payer: No Payment, Other | Attending: Nurse Practitioner | Admitting: Nurse Practitioner

## 2021-03-03 ENCOUNTER — Encounter (HOSPITAL_COMMUNITY): Payer: Self-pay | Admitting: Emergency Medicine

## 2021-03-03 ENCOUNTER — Other Ambulatory Visit: Payer: Self-pay

## 2021-03-03 ENCOUNTER — Ambulatory Visit (HOSPITAL_COMMUNITY)
Admission: RE | Admit: 2021-03-03 | Discharge: 2021-03-03 | Disposition: A | Discharge status: Home / Self Care | Payer: Self-pay | Attending: Psychiatry | Admitting: Psychiatry

## 2021-03-03 DIAGNOSIS — F17210 Nicotine dependence, cigarettes, uncomplicated: Secondary | ICD-10-CM | POA: Insufficient documentation

## 2021-03-03 DIAGNOSIS — F3181 Bipolar II disorder: Secondary | ICD-10-CM | POA: Insufficient documentation

## 2021-03-03 DIAGNOSIS — F1020 Alcohol dependence, uncomplicated: Secondary | ICD-10-CM | POA: Insufficient documentation

## 2021-03-03 DIAGNOSIS — F1290 Cannabis use, unspecified, uncomplicated: Secondary | ICD-10-CM | POA: Insufficient documentation

## 2021-03-03 DIAGNOSIS — Z20822 Contact with and (suspected) exposure to covid-19: Secondary | ICD-10-CM | POA: Insufficient documentation

## 2021-03-03 DIAGNOSIS — F845 Asperger's syndrome: Secondary | ICD-10-CM | POA: Insufficient documentation

## 2021-03-03 DIAGNOSIS — Z8659 Personal history of other mental and behavioral disorders: Secondary | ICD-10-CM | POA: Insufficient documentation

## 2021-03-03 DIAGNOSIS — F4310 Post-traumatic stress disorder, unspecified: Secondary | ICD-10-CM | POA: Insufficient documentation

## 2021-03-03 DIAGNOSIS — R45851 Suicidal ideations: Secondary | ICD-10-CM | POA: Insufficient documentation

## 2021-03-03 DIAGNOSIS — Z9151 Personal history of suicidal behavior: Secondary | ICD-10-CM | POA: Insufficient documentation

## 2021-03-03 LAB — POC SARS CORONAVIRUS 2 AG: SARSCOV2ONAVIRUS 2 AG: NEGATIVE

## 2021-03-03 LAB — POCT URINE DRUG SCREEN - MANUAL ENTRY (I-SCREEN)
POC Amphetamine UR: NOT DETECTED
POC Buprenorphine (BUP): NOT DETECTED
POC Cocaine UR: NOT DETECTED
POC Marijuana UR: POSITIVE — AB
POC Methadone UR: NOT DETECTED
POC Methamphetamine UR: NOT DETECTED
POC Morphine: NOT DETECTED
POC Oxazepam (BZO): NOT DETECTED
POC Oxycodone UR: NOT DETECTED
POC Secobarbital (BAR): NOT DETECTED

## 2021-03-03 LAB — POC SARS CORONAVIRUS 2 AG -  ED: SARS Coronavirus 2 Ag: NEGATIVE

## 2021-03-03 MED ORDER — LOPERAMIDE HCL 2 MG PO CAPS: 2.0000 mg | ORAL_CAPSULE | ORAL | Status: DC | PRN

## 2021-03-03 MED ORDER — MAGNESIUM HYDROXIDE 400 MG/5ML PO SUSP: 30.0000 mL | Freq: Every day | ORAL | Status: DC | PRN

## 2021-03-03 MED ORDER — LORAZEPAM 1 MG PO TABS: 1.0000 mg | ORAL_TABLET | Freq: Two times a day (BID) | ORAL | Status: DC

## 2021-03-03 MED ORDER — ACETAMINOPHEN 325 MG PO TABS: 650.0000 mg | ORAL_TABLET | Freq: Four times a day (QID) | ORAL | Status: DC | PRN

## 2021-03-03 MED ORDER — ADULT MULTIVITAMIN W/MINERALS CH
1.0000 | ORAL_TABLET | Freq: Every day | ORAL | Status: DC
  Administered 2021-03-03 – 2021-03-04 (×2): 1 via ORAL
  Filled 2021-03-03 (×2): qty 1

## 2021-03-03 MED ORDER — LORAZEPAM 1 MG PO TABS
1.0000 mg | ORAL_TABLET | Freq: Four times a day (QID) | ORAL | Status: DC
  Administered 2021-03-03: 1 mg via ORAL
  Filled 2021-03-03: qty 1

## 2021-03-03 MED ORDER — HYDROXYZINE HCL 25 MG PO TABS
25.0000 mg | ORAL_TABLET | Freq: Four times a day (QID) | ORAL | Status: DC | PRN
  Administered 2021-03-03: 25 mg via ORAL
  Filled 2021-03-03: qty 1
  Filled 2021-03-03: qty 15

## 2021-03-03 MED ORDER — LORAZEPAM 1 MG PO TABS: 1.0000 mg | ORAL_TABLET | Freq: Three times a day (TID) | ORAL | Status: DC

## 2021-03-03 MED ORDER — ONDANSETRON 4 MG PO TBDP: 4.0000 mg | ORAL_TABLET | Freq: Four times a day (QID) | ORAL | Status: DC | PRN

## 2021-03-03 MED ORDER — ALUM & MAG HYDROXIDE-SIMETH 200-200-20 MG/5ML PO SUSP: 30.0000 mL | ORAL | Status: DC | PRN

## 2021-03-03 MED ORDER — LORAZEPAM 1 MG PO TABS: 1.0000 mg | ORAL_TABLET | Freq: Four times a day (QID) | ORAL | Status: DC | PRN

## 2021-03-03 MED ORDER — LORAZEPAM 1 MG PO TABS: 1.0000 mg | ORAL_TABLET | Freq: Every day | ORAL | Status: DC

## 2021-03-03 MED ORDER — THIAMINE HCL 100 MG PO TABS
100.0000 mg | ORAL_TABLET | Freq: Every day | ORAL | Status: DC
  Administered 2021-03-04: 100 mg via ORAL
  Filled 2021-03-03: qty 1

## 2021-03-03 MED ORDER — TRAZODONE HCL 50 MG PO TABS
50.0000 mg | ORAL_TABLET | Freq: Every evening | ORAL | Status: DC | PRN
  Administered 2021-03-03: 50 mg via ORAL
  Filled 2021-03-03: qty 1

## 2021-03-03 NOTE — ED Provider Notes (Addendum)
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 44  >40 mg/dL Final  . Total CHOL/HDL Ratio 03/03/2021 3.2  RATIO Final  . VLDL 03/03/2021 17  0 - 40 mg/dL Final  . LDL Cholesterol 03/03/2021 81  0 - 99 mg/dL Final   Comment:        Total Cholesterol/HDL:CHD Risk Coronary Heart Disease Risk Table                     Men   Women  1/2 Average Risk   3.4   3.3  Average Risk       5.0   4.4  2 X Average Risk   9.6   7.1  3 X Average Risk  23.4   11.0        Use the calculated Patient Ratio above and the CHD Risk Table to determine the patient's CHD Risk.        ATP III CLASSIFICATION (LDL):  <100     mg/dL   Optimal  275-170  mg/dL   Near or Above                    Optimal  130-159  mg/dL   Borderline  017-494  mg/dL   High  >496     mg/dL   Very High Performed at [facility], [address], [place], [place] [address]   . TSH 03/03/2021 1.309  0.350 - 4.500 uIU/mL Final   Comment: Performed by a 3rd Generation assay with a functional sensitivity of <=0.01 uIU/mL. Performed at [facility], [address], [place], [place] [address]   . POC Amphetamine UR 03/03/2021 None Detected  NONE DETECTED (Cut Off Level 1000 ng/mL) Final  . POC Secobarbital (BAR) 03/03/2021 None Detected  NONE DETECTED (Cut Off Level 300 ng/mL) Final  . POC Buprenorphine (BUP) 03/03/2021 None Detected  NONE DETECTED (Cut Off Level 10 ng/mL) Final  . POC Oxazepam (BZO) 03/03/2021 None Detected  NONE DETECTED (Cut Off Level 300 ng/mL) Final  . POC Cocaine UR 03/03/2021 None Detected  NONE DETECTED (Cut Off Level 300 ng/mL) Final  . POC Methamphetamine UR 03/03/2021 None Detected  NONE DETECTED (Cut Off Level 1000 ng/mL) Final  . POC Morphine 03/03/2021 None Detected  NONE DETECTED (Cut Off Level 300 ng/mL) Final  . POC Oxycodone UR 03/03/2021 None Detected  NONE DETECTED (Cut Off Level 100 ng/mL) Final  . POC Methadone UR 03/03/2021 None Detected  NONE DETECTED (Cut Off Level 300 ng/mL) Final  . POC Marijuana UR 03/03/2021 Positive (A) NONE DETECTED (Cut Off Level 50 ng/mL) Final  . HIV Screen 4th Generation wRfx 03/03/2021 Non Reactive  Non Reactive Final   Performed at [facility], [address], [place], [place] [address]  . SARSCOV2ONAVIRUS 2 AG 03/03/2021 NEGATIVE  NEGATIVE Final   Comment: (NOTE) SARS-CoV-2 antigen NOT DETECTED.   Negative results are presumptive.  Negative results do not preclude SARS-CoV-2 infection and should not  be used as the sole basis for treatment or other patient management decisions, including infection  control decisions, particularly in the presence of clinical signs and  symptoms consistent with COVID-19, or in those who have been in contact with the virus.  Negative results must be combined with clinical observations, patient history, and  epidemiological information. The expected result is Negative.  Fact Sheet for Patients: https://www.jennings-kim.com/  Fact Sheet for Healthcare Providers: https://alexander-rogers.biz/  This test is not yet approved or cleared by the [place] FDA and  has been authorized for detection and/or diagnosis of SARS-CoV-2 by FDA under an Emergency Use Authorization (EUA).  This EUA will remain in effect (meaning this test can be used) for the duration of  the COV                          ID-19 declaration under Section 564(b)(1) of the Act, 21 U.S.C. section 360bbb-3(b)(1), unless the authorization is terminated or revoked sooner.      Allergies: Patient has no known allergies.  PTA Medications: (Not in a hospital admission)   Medical Decision Making  Admit to continuous assessment for crisis stabilization  Restart Zyprexa 10 mg QHS for Bipolar Disorder/Mood Stability  Initiate CIWA protocol  -lorazepam 1 mg every 6 hours prn for CIWA >10 -thiamine 100 mg daily for nutritional supplementation -hydroxyzine 25 mg every 6 hours prn for anxiety, CIWA < or = 10 -ondansetron 4 mg ODT every 6 hours prn nausea/vomiting -loperamide 2-4 mg capsule prn diarrhea or loose stools    Clinical Course as of 03/04/21 0511  Fri Mar 04, 2021  0055 Platelets(!): 125 Platelets 125, CBC otherwise unremarkable [JB]  0056 Hemoglobin A1C: 5.5 [JB]  0056 POCT Urine Drug Screen - (ICup)(!) UDS positive for marijuana, otherwise negative [JB]  0423 TSH: 1.309 [JB]  0423 Alcohol, Ethyl (B): <10 [JB]  0423 Hemoglobin A1C: 5.5 [JB]  0423 SARS Coronavirus 2 by RT PCR: NEGATIVE [JB]    Clinical Course User Index [JB] Jackelyn Poling, NP    Recommendations  Based on my evaluation the patient does not appear to have an emergency medical condition.   Jackelyn Poling, NP 03/04/21  4:24 AM
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
## 2021-03-03 NOTE — ED Notes (Signed)
[facility] transfer, pt presents with suicidal ideations, plan to get hit by a car.  Pt also reports alcohol withdrawal. Pt reports drinking a case of beer per day.  Pt also admits to sexually promiscuous behavior.  A&O x 4, no distress noted. Calm & cooperative. Monitoring for safety.  Skin search completed.

## 2021-03-04 ENCOUNTER — Other Ambulatory Visit (HOSPITAL_COMMUNITY): Payer: Self-pay

## 2021-03-04 LAB — RESP PANEL BY RT-PCR (FLU A&B, COVID) ARPGX2
Influenza A by PCR: NEGATIVE
Influenza B by PCR: NEGATIVE
SARS Coronavirus 2 by RT PCR: NEGATIVE

## 2021-03-04 LAB — CBC WITH DIFFERENTIAL/PLATELET
Abs Immature Granulocytes: 0.02 10*3/uL (ref 0.00–0.07)
Basophils Absolute: 0.1 10*3/uL (ref 0.0–0.1)
Basophils Relative: 1 %
Eosinophils Absolute: 0.4 10*3/uL (ref 0.0–0.5)
Eosinophils Relative: 5 %
HCT: 44.7 % (ref 39.0–52.0)
Hemoglobin: 15.8 g/dL (ref 13.0–17.0)
Immature Granulocytes: 0 %
Lymphocytes Relative: 32 %
Lymphs Abs: 2.5 10*3/uL (ref 0.7–4.0)
MCH: 32 pg (ref 26.0–34.0)
MCHC: 35.3 g/dL (ref 30.0–36.0)
MCV: 90.7 fL (ref 80.0–100.0)
Monocytes Absolute: 0.6 10*3/uL (ref 0.1–1.0)
Monocytes Relative: 8 %
Neutro Abs: 4.2 10*3/uL (ref 1.7–7.7)
Neutrophils Relative %: 54 %
Platelets: 125 10*3/uL — ABNORMAL LOW (ref 150–400)
RBC: 4.93 MIL/uL (ref 4.22–5.81)
RDW: 13.4 % (ref 11.5–15.5)
WBC: 7.8 10*3/uL (ref 4.0–10.5)
nRBC: 0 % (ref 0.0–0.2)

## 2021-03-04 LAB — LIPID PANEL
Cholesterol: 142 mg/dL (ref 0–200)
HDL: 44 mg/dL (ref >40)
LDL Cholesterol: 81 mg/dL (ref 0–99)
Total CHOL/HDL Ratio: 3.2 RATIO
Triglycerides: 84 mg/dL (ref <150)
VLDL: 17 mg/dL (ref 0–40)

## 2021-03-04 LAB — COMPREHENSIVE METABOLIC PANEL
ALT: 23 U/L (ref 0–44)
AST: 19 U/L (ref 15–41)
Albumin: 4.3 g/dL (ref 3.5–5.0)
Alkaline Phosphatase: 66 U/L (ref 38–126)
Anion gap: 11 (ref 5–15)
BUN: 14 mg/dL (ref 6–20)
CO2: 22 mmol/L (ref 22–32)
Calcium: 9.4 mg/dL (ref 8.9–10.3)
Chloride: 103 mmol/L (ref 98–111)
Creatinine, Ser: 0.92 mg/dL (ref 0.61–1.24)
GFR, Estimated: >60 mL/min (ref >60)
Glucose, Bld: 98 mg/dL (ref 70–99)
Potassium: 3.5 mmol/L (ref 3.5–5.1)
Sodium: 136 mmol/L (ref 135–145)
Total Bilirubin: 0.5 mg/dL (ref 0.3–1.2)
Total Protein: 6.7 g/dL (ref 6.5–8.1)

## 2021-03-04 LAB — TSH: TSH: 1.309 u[IU]/mL (ref 0.350–4.500)

## 2021-03-04 LAB — GC/CHLAMYDIA PROBE AMP (~~LOC~~) NOT AT ARMC
Chlamydia: NEGATIVE
Comment: NEGATIVE
Comment: NORMAL
Neisseria Gonorrhea: NEGATIVE

## 2021-03-04 LAB — HEMOGLOBIN A1C
Hgb A1c MFr Bld: 5.5 % (ref 4.8–5.6)
Mean Plasma Glucose: 111.15 mg/dL

## 2021-03-04 LAB — HIV ANTIBODY (ROUTINE TESTING W REFLEX): HIV Screen 4th Generation wRfx: NONREACTIVE

## 2021-03-04 LAB — ETHANOL: Alcohol, Ethyl (B): <10 mg/dL (ref <10)

## 2021-03-04 MED ORDER — HYDROXYZINE HCL 25 MG PO TABS
25.0000 mg | ORAL_TABLET | Freq: Three times a day (TID) | ORAL | Status: DC | PRN
  Filled 2021-03-04: qty 15

## 2021-03-04 MED ORDER — OLANZAPINE 10 MG PO TABS
10.0000 mg | ORAL_TABLET | Freq: Every day | ORAL | Status: DC
  Filled 2021-03-04: qty 7

## 2021-03-04 MED ORDER — NICOTINE 21 MG/24HR TD PT24
21.0000 mg | MEDICATED_PATCH | TRANSDERMAL | 0 refills | Status: AC
  Filled 2021-03-04: qty 7, 7d supply, fill #0

## 2021-03-04 MED ORDER — OLANZAPINE 10 MG PO TBDP
10.0000 mg | ORAL_TABLET | Freq: Once | ORAL | Status: AC
  Administered 2021-03-04: 10 mg via ORAL
  Filled 2021-03-04: qty 1

## 2021-03-04 MED ORDER — HYDROXYZINE HCL 25 MG PO TABS: 25.0000 mg | ORAL_TABLET | Freq: Three times a day (TID) | ORAL | 0 refills | Status: DC

## 2021-03-04 NOTE — ED Notes (Signed)
Discharge instructions provided and Pt stated understanding. Personal belongings returned from locker number 31. Pt alert, orient and ambulatory. Pt escorted to the front lobby. Safety maintained.

## 2021-03-04 NOTE — ED Notes (Signed)
Pt sleeping at present, no distress noted, monitoring for safety. 

## 2021-03-04 NOTE — Discharge Instructions (Addendum)
Patient is instructed prior to discharge to: Take all medications as prescribed by his/her mental healthcare provider. Continue Zyprexa 10 mg once daily at bedtime and Hydroxyzine 25 mg three times daily as needed for anxiety. Report any adverse effects and or reactions from the medicines to his/her outpatient provider promptly. Patient has been instructed & cautioned: To not engage in alcohol and or illegal drug use while on prescription medicines. In the event of worsening symptoms, patient is instructed to call the crisis hotline, 911 and or go to the nearest ED for appropriate evaluation and treatment of symptoms. To follow-up with his/her primary care provider for your other medical issues, concerns and or health care needs. Follow-up with [facility] on 7/18 at 8:20 AM.

## 2021-03-04 NOTE — ED Notes (Signed)
Pt iven lunch.

## 2021-03-04 NOTE — ED Provider Notes (Signed)
FBC/OBS ASAP Discharge Summary  Date and Time: 03/04/2021 9:43 AM  Name: Shawn Ford  MRN:  [id number]   Discharge Diagnoses:  Final diagnoses:  Bipolar II disorder (HCC)  PTSD (post-traumatic stress disorder)  Alcohol use disorder, severe, dependence (HCC)    Subjective: Patient seen and chart reviewed. He has been medication compliant and has not been a management problem on the unit. Patient slept well last night and was cooperative on assessment. He denies SI/HI/AVH, anxiety, paranoia, and other complaints. This Clinical research associate called mom and confirmed that he was able to return. Patient was provided with 7 day samples for Zyprexa 10 mg and Hydroxyzine 25 mg TID PRN anxiety. He will follow-up with [facility] on Monday, 7/18 at 8:20 AM for medication management.  Stay Summary: Shawn Ford is a 30 year old male with a history of bipolar 2 disorder, PTSD, and Asperger's syndrome presenting to the [facility] voluntarily for SI w/ plan and help with alcohol use. He was monitored overnight, restarted on Zyprexa, and will be discharged to mom's care with medication samples and resources for individual therapy and alcohol use therapy, which he requests.   Total Time spent with patient: 20 minutes this AM.  Past Psychiatric History: See HPI and H&P for more details. Past Medical History:  Past Medical History:  Diagnosis Date   Bipolar 1 disorder [facility])     Past Surgical History:  Procedure Laterality Date   ORIF RADIAL FRACTURE Left 01/24/2017   Procedure: OPEN REDUCTION INTERNAL FIXATION (ORIF) RADIAL FRACTURE;  Surgeon: Dominica Severin, MD;  Location: [facility];  Service: Orthopedics;  Laterality: Left;   TYMPANOSTOMY TUBE PLACEMENT Bilateral 1995   Family History: History reviewed. No pertinent family history. Family Psychiatric History: None reported Social History:  Social History   Substance and Sexual Activity  Alcohol Use Yes   Comment: socially     Social History   Substance  and Sexual Activity  Drug Use Yes   Types: Marijuana    Social History   Socioeconomic History   Marital status: Single    Spouse name: Not on file   Number of children: Not on file   Years of education: Not on file   Highest education level: Not on file  Occupational History   Not on file  Tobacco Use   Smoking status: Every Day   Smokeless tobacco: Never  Vaping Use   Vaping Use: Never used  Substance and Sexual Activity   Alcohol use: Yes    Comment: socially   Drug use: Yes    Types: Marijuana   Sexual activity: Not on file  Other Topics Concern   Not on file  Social History Narrative   Not on file   Social Determinants of Health   Financial Resource Strain: Not on file  Food Insecurity: Not on file  Transportation Needs: Not on file  Physical Activity: Not on file  Stress: Not on file  Social Connections: Not on file   SDOH:  SDOH Screenings   Alcohol Screen: Not on file  Depression (PHQ2-9): Not on file  Financial Resource Strain: Not on file  Food Insecurity: Not on file  Housing: Not on file  Physical Activity: Not on file  Social Connections: Not on file  Stress: Not on file  Tobacco Use: High Risk   Smoking Tobacco Use: Every Day   Smokeless Tobacco Use: Never  Transportation Needs: Not on file    Tobacco Cessation:  A prescription for an FDA-approved tobacco cessation  medication provided at discharge  Current Medications:  Current Facility-Administered Medications  Medication Dose Route Frequency Provider Last Rate Last Admin   acetaminophen (TYLENOL) tablet 650 mg  650 mg Oral Q6H PRN Jackelyn Poling, NP       alum & mag hydroxide-simeth (MAALOX/MYLANTA) 200-200-20 MG/5ML suspension 30 mL  30 mL Oral Q4H PRN Nira Conn A, NP       hydrOXYzine (ATARAX/VISTARIL) tablet 25 mg  25 mg Oral Q6H PRN Nira Conn A, NP   25 mg at 03/03/21 2259   hydrOXYzine (ATARAX/VISTARIL) tablet 25 mg  25 mg Oral TID PRN Karsten Ro, MD       loperamide  (IMODIUM) capsule 2-4 mg  2-4 mg Oral PRN Jackelyn Poling, NP       LORazepam (ATIVAN) tablet 1 mg  1 mg Oral Q6H PRN Nira Conn A, NP       magnesium hydroxide (MILK OF MAGNESIA) suspension 30 mL  30 mL Oral Daily PRN Jackelyn Poling, NP       multivitamin with minerals tablet 1 tablet  1 tablet Oral Daily Nira Conn A, NP   1 tablet at 03/04/21 0934   OLANZapine (ZYPREXA) tablet 10 mg  10 mg Oral QHS Nira Conn A, NP       ondansetron (ZOFRAN-ODT) disintegrating tablet 4 mg  4 mg Oral Q6H PRN Nira Conn A, NP       thiamine tablet 100 mg  100 mg Oral Daily Nira Conn A, NP   100 mg at 03/04/21 0934   traZODone (DESYREL) tablet 50 mg  50 mg Oral QHS PRN Jackelyn Poling, NP   50 mg at 03/03/21 2259   Current Outpatient Medications  Medication Sig Dispense Refill   hydrOXYzine (ATARAX/VISTARIL) 25 MG tablet Take 1 tablet (25 mg total) by mouth 3 (three) times daily. 21 tablet 0   OLANZapine (ZYPREXA) 10 MG tablet Take 1 tablet (10 mg total) by mouth at bedtime. 90 tablet 0    PTA Medications: (Not in a hospital admission)   Musculoskeletal  Strength & Muscle Tone: within normal limits Gait & Station:  Patient in bed, did not observe Patient leans: N/A  Psychiatric Specialty Exam  Presentation  General Appearance: Casual; Appropriate for Environment  Eye Contact:Fair  Speech:Clear and Coherent  Speech Volume:Normal  Handedness: No data recorded  Mood and Affect  Mood:Euthymic  Affect:Congruent; Appropriate   Thought Process  Thought Processes:Goal Directed  Descriptions of Associations:Intact  Orientation:Full (Time, Place and Person)  Thought Content:Logical  Diagnosis of Schizophrenia or Schizoaffective disorder in past: No    Hallucinations:Hallucinations: None  Ideas of Reference:None  Suicidal Thoughts:Suicidal Thoughts: No SI Active Intent and/or Plan: With Intent; With Plan; With Means to Carry Out; With Access to Means  Homicidal  Thoughts:Homicidal Thoughts: No   Sensorium  Memory:Immediate Fair; Recent Fair; Remote Fair  Judgment:Good  Insight:Fair   Executive Functions  Concentration:Fair  Attention Span:Fair  Recall:Good  Fund of Knowledge:Good  Language:Good   Psychomotor Activity  Psychomotor Activity:Psychomotor Activity: Normal   Assets  Assets:Communication Skills; Desire for Improvement; Social Support; Resilience   Sleep  Sleep:Sleep: Good Number of Hours of Sleep: 7   Nutritional Assessment (For OBS and FBC admissions only) Has the patient had a weight loss or gain of 10 pounds or more in the last 3 months?: No Has the patient had a decrease in food intake/or appetite?: No Does the patient have dental problems?: No Does the patient have eating habits  or behaviors that may be indicators of an eating disorder including binging or inducing vomiting?: No Has the patient recently lost weight without trying?: No Has the patient been eating poorly because of a decreased appetite?: No Malnutrition Screening Tool Score: 0    Physical Exam  Physical Exam Vitals reviewed.  Constitutional:      General: He is not in acute distress. HENT:     Head: Normocephalic and atraumatic.     Mouth/Throat:     Mouth: Mucous membranes are dry.     Comments: No perioral swelling noted Eyes:     Extraocular Movements: Extraocular movements intact.  Cardiovascular:     Rate and Rhythm: Normal rate.  Pulmonary:     Effort: Pulmonary effort is normal.  Musculoskeletal:        General: Normal range of motion.     Cervical back: Normal range of motion.  Skin:    Coloration: Skin is not jaundiced.  Neurological:     General: No focal deficit present.     Mental Status: He is alert and oriented to person, place, and time.  Psychiatric:        Mood and Affect: Mood normal.        Behavior: Behavior normal.        Thought Content: Thought content normal.   Review of Systems  HENT:  Positive  for congestion.        Also endorses perioral swelling  Gastrointestinal:  Negative for diarrhea, nausea and vomiting.  Psychiatric/Behavioral:  Positive for substance abuse. Negative for hallucinations and suicidal ideas. The patient is not nervous/anxious and does not have insomnia.   Blood pressure (!) 135/97, pulse 69, temperature 98.2 F (36.8 C), temperature source Temporal, resp. rate 16, SpO2 100 %. There is no height or weight on file to calculate BMI.  Demographic Factors:  Male, Caucasian, Gay, lesbian, or bisexual orientation, and Unemployed  Loss Factors: Decrease in vocational status and Financial problems/change in socioeconomic status  Historical Factors: Prior suicide attempts  Risk Reduction Factors:   Living with another person, especially a relative, Positive social support, and Positive therapeutic relationship (although patient is inconsistent in seeking care  Continued Clinical Symptoms:  Bipolar Disorder:   Bipolar II Alcohol/Substance Abuse/Dependencies More than one psychiatric diagnosis Unstable or Poor Therapeutic Relationship Previous Psychiatric Diagnoses and Treatments  Cognitive Features That Contribute To Risk:  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  There are no identifiable plans, no associated intent, mild dysphoria and related symptoms, good self-control (both objective and subjective assessment), few other risk factors, and identifiable protective factors, including available and accessible social support.  Plan Of Care/Follow-up recommendations:  Activity:  Normal, as tolerated Diet:  Regular Other:  Follow-up with [facility] on 7/18 at 8:20 AM. Continue Zyprexa 10 mg at bedtime and Hydroxyzine 25 mg three times daily as needed for anxiety.  Disposition: Discharge home with mom.  Lamar Sprinkles, MD 03/04/2021, 9:43 AM

## 2021-03-04 NOTE — ED Notes (Signed)
Pt was given breakfast  ?

## 2021-03-07 ENCOUNTER — Other Ambulatory Visit: Payer: Self-pay

## 2021-03-07 ENCOUNTER — Encounter: Payer: Self-pay | Admitting: Adult Health

## 2021-03-07 ENCOUNTER — Ambulatory Visit (INDEPENDENT_AMBULATORY_CARE_PROVIDER_SITE_OTHER): Payer: Self-pay | Admitting: Adult Health

## 2021-03-07 VITALS — BP 139/93 | Ht 72.0 in | Wt 163.0 lb

## 2021-03-07 DIAGNOSIS — G4700 Insomnia, unspecified: Secondary | ICD-10-CM

## 2021-03-07 DIAGNOSIS — F4310 Post-traumatic stress disorder, unspecified: Secondary | ICD-10-CM

## 2021-03-07 DIAGNOSIS — F1210 Cannabis abuse, uncomplicated: Secondary | ICD-10-CM

## 2021-03-07 DIAGNOSIS — F840 Autistic disorder: Secondary | ICD-10-CM

## 2021-03-07 DIAGNOSIS — F1010 Alcohol abuse, uncomplicated: Secondary | ICD-10-CM

## 2021-03-07 DIAGNOSIS — F3181 Bipolar II disorder: Secondary | ICD-10-CM

## 2021-03-07 MED ORDER — OLANZAPINE 10 MG PO TABS: 10.0000 mg | ORAL_TABLET | Freq: Every day | ORAL | 0 refills | Status: DC

## 2021-03-07 MED ORDER — HYDROXYZINE HCL 25 MG PO TABS: 25.0000 mg | ORAL_TABLET | Freq: Three times a day (TID) | ORAL | 0 refills | Status: DC

## 2021-03-07 NOTE — Progress Notes (Signed)
[facility] MD/PA/NP Initial Note  03/07/2021 9:45 AM Shawn Ford  MRN:  [id number]  Chief Complaint:   HPI:   Last seen 02/13/2019 with Dr Jennelle Human.  Describes mood today as "ok". Pleasant. Tearful at times. Mood symptoms - reports depression, anxiety, and irritability. Seen at behavioral health 4 days ago - stayed overnight and discharged to mother's care. Plans to stay with mother until mental health is stable. Was restarted on Zyprexa while hospitalized and feels it has been helpful. Stating "my brain has slowed down a lot since starting Zyprexa". Has started sleeping again. Stopped using alcohol. Has attended AA meetings - presenting chip for attendance. Stating "when I look at this chip, it's a reminder for me to stay away from alcohol". Plans to get a sponsor to help on his journey to sobriety. Currently planning to stay with mother and his step father while getting mood stabilized. Received an inheritance from his father's estate and is trying to determine best use of these funds. Entertaining business ideas. Improved interest and motivation. Taking medications as prescribed.  Energy levels typically higher. Active, has a regular exercise routine - walking 2 miles a day. Enjoys some usual interests and activities. Single. Lives in an apartment with room-mates.  Spending time with family. Appetite adequate. Weight stable - 163 pounds. Sleeps better some nights than others. Averages 5 to 6 hours. Focus and concentration stable. Unemployed. Completing tasks. Managing aspects of household.  Denies SI or HI.  Denies AH or VH. Denies Paranoia. Substance use - smokes THC and alcohol abuse. History of cocaine use. History of "pill" use - Adderall.  Past Psychiatric Medication Trials: Lithium nausea, lamotrigine, Depakote with benefit, risperidone, sertraline, olanzapine 10, Viibryd, Abilify 10  History of not keeping regular appointments  Visit Diagnosis:    ICD-10-CM   1. Alcohol abuse   F10.10 hydrOXYzine (ATARAX/VISTARIL) 25 MG tablet    2. Autism spectrum disorder  F84.0 OLANZapine (ZYPREXA) 10 MG tablet    3. PTSD (post-traumatic stress disorder)  F43.10 OLANZapine (ZYPREXA) 10 MG tablet    4. Insomnia, unspecified type  G47.00 OLANZapine (ZYPREXA) 10 MG tablet    hydrOXYzine (ATARAX/VISTARIL) 25 MG tablet    5. Bipolar II disorder (HCC)  F31.81     6. Mild tetrahydrocannabinol (THC) abuse  F12.10       Past Psychiatric History: Denies psychiatric hospitalization.   Past Medical History:  Past Medical History:  Diagnosis Date   Bipolar 1 disorder [facility])     Past Surgical History:  Procedure Laterality Date   ORIF RADIAL FRACTURE Left 01/24/2017   Procedure: OPEN REDUCTION INTERNAL FIXATION (ORIF) RADIAL FRACTURE;  Surgeon: Dominica Severin, MD;  Location: [facility];  Service: Orthopedics;  Laterality: Left;   TYMPANOSTOMY TUBE PLACEMENT Bilateral 1995    Family Psychiatric History: Family history of mental illness.   Family History: No family history on file.  Social History:  Social History   Socioeconomic History   Marital status: Single    Spouse name: Not on file   Number of children: Not on file   Years of education: Not on file   Highest education level: Not on file  Occupational History   Not on file  Tobacco Use   Smoking status: Every Day   Smokeless tobacco: Never  Vaping Use   Vaping Use: Never used  Substance and Sexual Activity   Alcohol use: Yes    Comment: socially   Drug use: Yes    Types: Marijuana   Sexual activity:  Not on file  Other Topics Concern   Not on file  Social History Narrative   Not on file   Social Determinants of Health   Financial Resource Strain: Not on file  Food Insecurity: Not on file  Transportation Needs: Not on file  Physical Activity: Not on file  Stress: Not on file  Social Connections: Not on file    Allergies: No Known Allergies  Metabolic Disorder Labs: Lab Results  Component Value  Date   HGBA1C 5.5 03/03/2021   MPG 111.15 03/03/2021   No results found for: PROLACTIN Lab Results  Component Value Date   CHOL 142 03/03/2021   TRIG 84 03/03/2021   HDL 44 03/03/2021   CHOLHDL 3.2 03/03/2021   VLDL 17 03/03/2021   LDLCALC 81 03/03/2021   Lab Results  Component Value Date   TSH 1.309 03/03/2021    Therapeutic Level Labs: No results found for: LITHIUM No results found for: VALPROATE No components found for:  CBMZ  Current Medications: Current Outpatient Medications  Medication Sig Dispense Refill   hydrOXYzine (ATARAX/VISTARIL) 25 MG tablet Take 1 tablet (25 mg total) by mouth 3 (three) times daily. 90 tablet 0   nicotine (NICODERM CQ - DOSED IN MG/24 HOURS) 21 mg/24hr patch Place 1 patch (21 mg total) onto the skin daily for 7 days. 7 patch 0   OLANZapine (ZYPREXA) 10 MG tablet Take 1 tablet (10 mg total) by mouth at bedtime. 30 tablet 0   No current facility-administered medications for this visit.    Medication Side Effects: none  Orders placed this visit:  No orders of the defined types were placed in this encounter.   Psychiatric Specialty Exam:  Review of Systems  Blood pressure (!) 139/93, height 6' (1.829 m), weight 163 lb (73.9 kg).Body mass index is 22.11 kg/m.  General Appearance: Casual and Neat  Eye Contact:  Good  Speech:  Clear and Coherent and Normal Rate  Volume:  Normal  Mood:  Anxious  Affect:  Appropriate and Non-Congruent  Thought Process:  Coherent and Descriptions of Associations: Intact  Orientation:  Full (Time, Place, and Person)  Thought Content: Logical and Rumination   Suicidal Thoughts:  No  Homicidal Thoughts:  No  Memory:  WNL  Judgement:  Good  Insight:  Fair  Psychomotor Activity:  Normal  Concentration:  Concentration: Good  Recall:  Good  Fund of Knowledge: Good  Language: Good  Assets:  Communication Skills Desire for Improvement Financial Resources/Insurance Housing Intimacy Leisure  Time Physical Health Resilience Social Support Talents/Skills Transportation Vocational/Educational  ADL's:  Intact  Cognition: WNL  Prognosis:  Good   Screenings:  Flowsheet Row ED from 03/03/2021 in [facility] Most recent reading at 03/03/2021 11:18 PM [facility] Most recent reading at 03/03/2021  9:30 PM [facility] Most recent reading at 01/24/2021  3:53 AM  C-SSRS RISK CATEGORY High Risk High Risk No Risk       Receiving Psychotherapy: No   Treatment Plan/Recommendations: Plan:  PDMP reviewed  Olanzapine 10mg  at night Hydroxyzine 25mg  TID  Time spent with patient was 60 minutes. Greater than 50% of face to face time with patient was spent on counseling and coordination of care.    RTC 4 weeks  Patient advised to contact office with any questions, adverse effects, or acute worsening in signs and symptoms.   Discussed potential metabolic side effects associated  with atypical antipsychotics, as well as potential risk for movement side effects. Advised pt to contact office if movement side effects occur.     Dorothyann Gibbs, NP

## 2021-03-15 ENCOUNTER — Other Ambulatory Visit (HOSPITAL_COMMUNITY): Payer: Self-pay

## 2021-03-19 ENCOUNTER — Other Ambulatory Visit (HOSPITAL_COMMUNITY): Payer: Self-pay

## 2021-03-22 ENCOUNTER — Telehealth: Payer: Self-pay | Admitting: Adult Health

## 2021-03-22 NOTE — Telephone Encounter (Signed)
Pt called in needing referral for [facility]. Appt 8/15. Pls RTC [phone] with any questions.

## 2021-03-23 NOTE — Telephone Encounter (Signed)
Pt will discuss at appt

## 2021-03-26 ENCOUNTER — Encounter (HOSPITAL_COMMUNITY): Payer: Self-pay | Admitting: Emergency Medicine

## 2021-03-26 ENCOUNTER — Ambulatory Visit (HOSPITAL_COMMUNITY)
Admission: RE | Admit: 2021-03-26 | Discharge: 2021-03-26 | Disposition: A | Discharge status: Home / Self Care | Payer: No Payment, Other | Attending: Psychiatry | Admitting: Psychiatry

## 2021-03-26 ENCOUNTER — Other Ambulatory Visit: Payer: Self-pay

## 2021-03-26 ENCOUNTER — Emergency Department (HOSPITAL_COMMUNITY)
Admission: EM | Admit: 2021-03-26 | Discharge: 2021-03-27 | Disposition: A | Discharge status: Other Acute Inpatient Hospital | Payer: Self-pay | Attending: Emergency Medicine | Admitting: Emergency Medicine

## 2021-03-26 DIAGNOSIS — F1010 Alcohol abuse, uncomplicated: Secondary | ICD-10-CM

## 2021-03-26 DIAGNOSIS — F17210 Nicotine dependence, cigarettes, uncomplicated: Secondary | ICD-10-CM | POA: Insufficient documentation

## 2021-03-26 DIAGNOSIS — F3181 Bipolar II disorder: Secondary | ICD-10-CM | POA: Insufficient documentation

## 2021-03-26 DIAGNOSIS — Z8659 Personal history of other mental and behavioral disorders: Secondary | ICD-10-CM

## 2021-03-26 DIAGNOSIS — Y900 Blood alcohol level of less than 20 mg/100 ml: Secondary | ICD-10-CM | POA: Insufficient documentation

## 2021-03-26 DIAGNOSIS — Z20822 Contact with and (suspected) exposure to covid-19: Secondary | ICD-10-CM | POA: Insufficient documentation

## 2021-03-26 DIAGNOSIS — F10129 Alcohol abuse with intoxication, unspecified: Secondary | ICD-10-CM | POA: Insufficient documentation

## 2021-03-26 DIAGNOSIS — R45851 Suicidal ideations: Secondary | ICD-10-CM | POA: Insufficient documentation

## 2021-03-26 DIAGNOSIS — F1410 Cocaine abuse, uncomplicated: Secondary | ICD-10-CM | POA: Diagnosis present

## 2021-03-26 LAB — COMPREHENSIVE METABOLIC PANEL
ALT: 15 U/L (ref 0–44)
AST: 15 U/L (ref 15–41)
Albumin: 4.8 g/dL (ref 3.5–5.0)
Alkaline Phosphatase: 72 U/L (ref 38–126)
Anion gap: 9 (ref 5–15)
BUN: 10 mg/dL (ref 6–20)
CO2: 24 mmol/L (ref 22–32)
Calcium: 9.5 mg/dL (ref 8.9–10.3)
Chloride: 105 mmol/L (ref 98–111)
Creatinine, Ser: 0.87 mg/dL (ref 0.61–1.24)
GFR, Estimated: >60 mL/min (ref >60)
Glucose, Bld: 105 mg/dL — ABNORMAL HIGH (ref 70–99)
Potassium: 3.5 mmol/L (ref 3.5–5.1)
Sodium: 138 mmol/L (ref 135–145)
Total Bilirubin: 0.6 mg/dL (ref 0.3–1.2)
Total Protein: 7.7 g/dL (ref 6.5–8.1)

## 2021-03-26 LAB — RESP PANEL BY RT-PCR (FLU A&B, COVID) ARPGX2
Influenza A by PCR: NEGATIVE
Influenza B by PCR: NEGATIVE
SARS Coronavirus 2 by RT PCR: NEGATIVE

## 2021-03-26 LAB — ETHANOL: Alcohol, Ethyl (B): <10 mg/dL (ref <10)

## 2021-03-26 LAB — ACETAMINOPHEN LEVEL: Acetaminophen (Tylenol), Serum: <10 ug/mL — ABNORMAL LOW (ref 10–30)

## 2021-03-26 LAB — CBC
HCT: 46.6 % (ref 39.0–52.0)
Hemoglobin: 16.3 g/dL (ref 13.0–17.0)
MCH: 31.7 pg (ref 26.0–34.0)
MCHC: 35 g/dL (ref 30.0–36.0)
MCV: 90.7 fL (ref 80.0–100.0)
Platelets: 150 10*3/uL (ref 150–400)
RBC: 5.14 MIL/uL (ref 4.22–5.81)
RDW: 13.5 % (ref 11.5–15.5)
WBC: 10 10*3/uL (ref 4.0–10.5)
nRBC: 0 % (ref 0.0–0.2)

## 2021-03-26 LAB — RAPID URINE DRUG SCREEN, HOSP PERFORMED
Amphetamines: NOT DETECTED
Barbiturates: NOT DETECTED
Benzodiazepines: NOT DETECTED
Cocaine: NOT DETECTED
Opiates: NOT DETECTED
Tetrahydrocannabinol: POSITIVE — AB

## 2021-03-26 LAB — SALICYLATE LEVEL: Salicylate Lvl: <7 mg/dL — ABNORMAL LOW (ref 7.0–30.0)

## 2021-03-26 MED ORDER — LORAZEPAM 2 MG/ML IJ SOLN: 0.0000 mg | Freq: Two times a day (BID) | INTRAMUSCULAR | Status: DC

## 2021-03-26 MED ORDER — ALUM & MAG HYDROXIDE-SIMETH 200-200-20 MG/5ML PO SUSP: 30.0000 mL | Freq: Four times a day (QID) | ORAL | Status: DC | PRN

## 2021-03-26 MED ORDER — OLANZAPINE 10 MG PO TABS
10.0000 mg | ORAL_TABLET | Freq: Every day | ORAL | Status: DC
  Administered 2021-03-26: 10 mg via ORAL
  Filled 2021-03-26: qty 1

## 2021-03-26 MED ORDER — ONDANSETRON HCL 4 MG PO TABS
4.0000 mg | ORAL_TABLET | Freq: Three times a day (TID) | ORAL | Status: DC | PRN
Start: 2021-03-26 — End: 2021-03-27

## 2021-03-26 MED ORDER — LORAZEPAM 1 MG PO TABS
0.0000 mg | ORAL_TABLET | Freq: Four times a day (QID) | ORAL | Status: DC
Start: 2021-03-26 — End: 2021-03-27
  Administered 2021-03-26: 1 mg via ORAL
  Filled 2021-03-26: qty 1

## 2021-03-26 MED ORDER — HYDROXYZINE HCL 25 MG PO TABS
25.0000 mg | ORAL_TABLET | Freq: Three times a day (TID) | ORAL | Status: DC
  Administered 2021-03-26 – 2021-03-27 (×2): 25 mg via ORAL
  Filled 2021-03-26 (×2): qty 1

## 2021-03-26 MED ORDER — LORAZEPAM 2 MG/ML IJ SOLN: 0.0000 mg | Freq: Four times a day (QID) | INTRAMUSCULAR | Status: DC

## 2021-03-26 MED ORDER — NICOTINE 21 MG/24HR TD PT24
21.0000 mg | MEDICATED_PATCH | Freq: Once | TRANSDERMAL | Status: DC
  Administered 2021-03-26: 21 mg via TRANSDERMAL
  Filled 2021-03-26: qty 1

## 2021-03-26 MED ORDER — LORAZEPAM 1 MG PO TABS: 0.0000 mg | ORAL_TABLET | Freq: Two times a day (BID) | ORAL | Status: DC

## 2021-03-26 MED ORDER — THIAMINE HCL 100 MG PO TABS
100.0000 mg | ORAL_TABLET | Freq: Every day | ORAL | Status: DC
  Administered 2021-03-26 – 2021-03-27 (×2): 100 mg via ORAL
  Filled 2021-03-26 (×2): qty 1

## 2021-03-26 MED ORDER — THIAMINE HCL 100 MG/ML IJ SOLN: 100.0000 mg | Freq: Every day | INTRAMUSCULAR | Status: DC

## 2021-03-26 MED ORDER — ACETAMINOPHEN 325 MG PO TABS: 650.0000 mg | ORAL_TABLET | ORAL | Status: DC | PRN

## 2021-03-26 NOTE — ED Triage Notes (Signed)
Pt c/o SI after recent loss of father in November. Pt also report alcohol intoxication, last drink 0200 today.

## 2021-03-26 NOTE — ED Notes (Signed)
Pt dressed out in purple scrubs, calm and cooperative at this time.

## 2021-03-26 NOTE — ED Provider Notes (Signed)
Emergency Medicine Provider Triage Evaluation Note  Shawn Ford , a 30 y.o. male  was evaluated in triage.  Pt complains of suicidal ideations and alcohol withdrawal.  Patient reports that he has been having suicidal thoughts since last 2023/07/25 when his dad died.  Patient reports attempting suicide at that time.  He denies any explicit plan for suicide at this time.  Patient denies any homicidal ideations or visual hallucinations.  Patient endorses auditory hallucinations.  He states that he hears the voice of his stepfather.  Voices not commanding or threatening.  Patient endorses daily alcohol use.  Patient states that he drinks approximately 1 case of beer every day.  Patient states that last alcoholic beverage was 0 200 this morning.  Patient states that he drinks as a way to harm himself.  Review of Systems  Positive: Suicidal ideations, auditory hallucinations Negative: Homicidal ideations, visual hallucinations, tactile hallucinations, chest pain, shortness of breath  Physical Exam  BP (!) 147/106 (BP Location: Right Arm)   Pulse 97   Temp 98.1 F (36.7 C) (Oral)   Resp 16   Ht 6\' 1"  (1.854 m)   Wt 72.6 kg   SpO2 100%   BMI 21.11 kg/m  Gen:   Awake, no distress   Resp:  Normal effort  MSK:   Moves extremities without difficulty  Other:    Medical Decision Making  Medically screening exam initiated at 6:08 PM.  Appropriate orders placed.  Shawn Ford was informed that the remainder of the evaluation will be completed by another provider, this initial triage assessment does not replace that evaluation, and the importance of remaining in the ED until their evaluation is complete.  The patient appears stable so that the remainder of the work up may be completed by another provider.      [name], PA-C 03/26/21 1811    05/26/21, MD 03/26/21 2337

## 2021-03-26 NOTE — H&P (Signed)
Behavioral Health Medical Screening Exam  Shawn Ford is an 30 y.o. male who presented to [facility] as walk-in with his mother for assessment of suicidal ideations. Patient presented barefoot, casually dressed. Noted history of Asperger's, bipolar II.   Patient last seen at [facility] 03/03/21 with similar presentation where he was monitored overnight and discharged with plan to follow up with outpatient provider. Per chart review patient was seen 03/07/21 by provider ([facility]) and called in 03/22/21 for referral to [facility]; next appointment noted to be 04/04/21.   On assessment patient presents concrete, flat, incongruent affect. Patient reports drinking unknown amount of alcohol "all night" and smoking unknown amount of weed. When asked what bought him in he replied, "I don't know probably just cocaine addiction". When asked if he used any cocaine patient began talking about his father. Loose associations noted. He continues to voice suicidal ideations with several plans including "pills" and "running into traffic". 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   He endorses "bad" sleep, no interests or hobbies, feelings of guilt and worthlessness, low energy "I only have the energy to drink", "pretty bad" concentration, "okay" appetite, and suicidal ideations. He denies any psychomotor changes, homicidal ideations, or visual hallucinations. He does appear to be under the influence of a substance with some responding to external/internal stimuli. Patient to be transferred to [facility] for medical clearance, further observation, stabilization, and treatment.   Total Time spent with patient: 20 minutes  Psychiatric Specialty Exam: Physical Exam Vitals and nursing note reviewed.  Constitutional:      General: He is not in acute distress.    Appearance: He is not ill-appearing,  toxic-appearing or diaphoretic.  HENT:     Head: Normocephalic.     Nose: Nose normal.     Mouth/Throat:     Mouth: Mucous membranes are moist.     Pharynx: Oropharynx is clear.  Eyes:     Pupils: Pupils are equal, round, and reactive to light.  Cardiovascular:     Rate and Rhythm: Normal rate.  Pulmonary:     Effort: Pulmonary effort is normal.  Abdominal:     General: Abdomen is flat.  Musculoskeletal:        General: Normal range of motion.     Cervical back: Normal range of motion.  Skin:    General: Skin is warm.  Neurological:     Mental Status: He is alert.  Psychiatric:        Attention and Perception: Attention normal.        Mood and Affect: Affect is flat.        Speech: Speech is tangential.        Behavior: Behavior is cooperative.        Thought Content: Thought content includes suicidal ideation.        Cognition and Memory: Memory normal. Cognition is impaired.        Judgment: Judgment is impulsive.   Review of Systems  Psychiatric/Behavioral:  Positive for sleep disturbance and suicidal ideas. Negative for self-injury.   All other systems reviewed and are negative. Blood pressure (!) 145/96, pulse 99, temperature 98.6 F (37 C), resp. rate 16, SpO2 99 %.There is no height or weight on file to calculate BMI. General Appearance: Casual and Disheveled Eye Contact:  Fair Speech:  Normal Rate Volume:  Normal Mood:  Dysphoric Affect:  Flat Thought Process:  Irrelevant Orientation:  Other:  partial Thought Content:  Rumination and concrete Suicidal Thoughts:  Yes.  with intent/plan Homicidal Thoughts:  No Memory:  Immediate;   Fair Remote;   inconsistent Judgement:  Impaired Insight:  Shallow Psychomotor Activity:  Normal Concentration: Concentration: Fair and Attention Span: Fair Recall:  [employer] of Knowledge:Fair Language: Good Akathisia:  NA Handed:  Left AIMS (if indicated):    Assets:  Medical laboratory scientific officer Housing Physical Health Resilience Social Support Transportation Sleep:     Musculoskeletal: Strength & Muscle Tone: within normal limits Gait & Station: normal Patient leans: N/A  Blood pressure (!) 145/96, pulse 99, temperature 98.6 F (37 C), resp. rate 16, SpO2 99 %.  Recommendations: Based on my evaluation the patient appears to have an emergency medical condition for which I recommend the patient be transferred to the emergency department for further evaluation. Patient to be transferred to [facility] ED for medical clearance and continuous observation; will be re-evaluated by psychiatry in the morning.   Loletta Parish, NP 03/26/2021, 2:35 PM
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
## 2021-03-26 NOTE — ED Provider Notes (Signed)
[facility]-EMERGENCY DEPT Provider Note   CSN: [id number] Arrival date & time: 03/26/21  1714     History Chief Complaint  Patient presents with   Alcohol Intoxication   Suicidal    Shawn Ford is a 30 y.o. male with a hx of bipolar 1 disorder, PSTD, & EtOH abuse who presents to the ED with complaints of suicidal ideation.  States he has been having waxing and waning suicidal ideation since Nov 21, 2021when his father passed away.  No specific plan reported to me.  No specific alleviating or aggravating factors.  He states that he went to behavioral health urgent care and they sent him over to the emergency department.  He states that he has drink daily, last drink was around 2 AM and he also smokes cigarettes.  He denies HI, hallucinations, chest pain, dyspnea, abdominal pain, or vomiting.  He typically takes olanzapine, he stopped taking it 2 days ago due to some increased stress.   HPI     Past Medical History:  Diagnosis Date   Bipolar 1 disorder [facility])     Patient Active Problem List   Diagnosis Date Noted   Alcohol abuse 03/26/2021   History of Asperger's syndrome 03/26/2021   Bipolar II disorder (HCC) 03/03/2021   PTSD (post-traumatic stress disorder) 07/07/2018   Displaced oblique fracture of shaft of left radius, initial encounter for closed fracture 01/24/2017   Radial fracture 01/24/2017    Past Surgical History:  Procedure Laterality Date   ORIF RADIAL FRACTURE Left 01/24/2017   Procedure: OPEN REDUCTION INTERNAL FIXATION (ORIF) RADIAL FRACTURE;  Surgeon: Dominica Severin, MD;  Location: [facility] ORS;  Service: Orthopedics;  Laterality: Left;   TYMPANOSTOMY TUBE PLACEMENT Bilateral 1995       No family history on file.  Social History   Tobacco Use   Smoking status: Every Day    Types: Cigarettes   Smokeless tobacco: Never  Vaping Use   Vaping Use: Never used  Substance Use Topics   Alcohol use: Yes    Alcohol/week: 24.0 standard  drinks    Types: 24 Cans of beer per week    Comment: case a day   Drug use: Yes    Types: Marijuana    Home Medications Prior to Admission medications   Medication Sig Start Date End Date Taking? Authorizing Provider  hydrOXYzine (ATARAX/VISTARIL) 25 MG tablet Take 1 tablet (25 mg total) by mouth 3 (three) times daily. 03/07/21  Yes Mozingo, Thereasa Solo, NP  OLANZapine (ZYPREXA) 10 MG tablet Take 1 tablet (10 mg total) by mouth at bedtime. 03/07/21  Yes Mozingo, Thereasa Solo, NP  albuterol (PROVENTIL HFA;VENTOLIN HFA) 108 (90 Base) MCG/ACT inhaler Inhale 2 puffs into the lungs every 6 (six) hours as needed for up to 10 days for wheezing or shortness of breath. Patient not taking: Reported on 03/03/2021 07/10/18 03/04/21  Benay Pike, NP  propranolol (INDERAL) 20 MG tablet Take 1-2 tablets (20-40 mg total) by mouth 2 (two) times daily as needed (anxiety). Patient not taking: No sig reported 02/13/19 03/04/21  Cottle, Steva Ready., MD    Allergies    Patient has no known allergies.  Review of Systems   Review of Systems  Constitutional:  Negative for chills and fever.  Respiratory:  Negative for shortness of breath.   Cardiovascular:  Negative for chest pain.  Gastrointestinal:  Negative for abdominal pain and vomiting.  Neurological:  Negative for syncope.  Psychiatric/Behavioral:  Positive for suicidal ideas.  Negative for hallucinations.   All other systems reviewed and are negative.  Physical Exam Updated Vital Signs BP (!) 147/106 (BP Location: Right Arm)   Pulse 97   Temp 98.1 F (36.7 C) (Oral)   Resp 16   Ht 6\' 1"  (1.854 m)   Wt 72.6 kg   SpO2 100%   BMI 21.11 kg/m   Physical Exam Vitals and nursing note reviewed.  Constitutional:      General: He is not in acute distress.    Appearance: He is well-developed. He is not toxic-appearing.  HENT:     Head: Normocephalic and atraumatic.  Eyes:     General:        Right eye: No discharge.        Left  eye: No discharge.     Conjunctiva/sclera: Conjunctivae normal.  Cardiovascular:     Rate and Rhythm: Normal rate and regular rhythm.  Pulmonary:     Effort: Pulmonary effort is normal. No respiratory distress.     Breath sounds: Normal breath sounds. No wheezing, rhonchi or rales.  Abdominal:     General: There is no distension.     Palpations: Abdomen is soft.     Tenderness: There is no abdominal tenderness.  Musculoskeletal:     Cervical back: Neck supple.  Skin:    General: Skin is warm and dry.     Findings: No rash.  Neurological:     Mental Status: He is alert.     Comments: Clear speech.   Psychiatric:        Behavior: Behavior normal.        Thought Content: Thought content includes suicidal ideation. Thought content does not include homicidal ideation.    ED Results / Procedures / Treatments   Labs (all labs ordered are listed, but only abnormal results are displayed) Labs Reviewed  RESP PANEL BY RT-PCR (FLU A&B, COVID) [id number]  COMPREHENSIVE METABOLIC PANEL  ETHANOL  SALICYLATE LEVEL  ACETAMINOPHEN LEVEL  CBC  RAPID URINE DRUG SCREEN, HOSP PERFORMED    EKG None  Radiology No results found.  Procedures Procedures   Medications Ordered in ED Medications  nicotine (NICODERM CQ - dosed in mg/24 hours) patch 21 mg (21 mg Transdermal Patch Applied 03/26/21 1800)  thiamine tablet 100 mg (has no administration in time range)    Or  thiamine (B-1) injection 100 mg (has no administration in time range)  LORazepam (ATIVAN) injection 0-4 mg (has no administration in time range)    Or  LORazepam (ATIVAN) tablet 0-4 mg (has no administration in time range)  LORazepam (ATIVAN) injection 0-4 mg (has no administration in time range)    Or  LORazepam (ATIVAN) tablet 0-4 mg (has no administration in time range)    ED Course  I have reviewed the triage vital signs and the nursing notes.  Pertinent labs & imaging results that were available during my care of the  patient were reviewed by me and considered in my medical decision making (see chart for details).    MDM Rules/Calculators/A&P                          Patient presents to the ED with complaints of SI.  Nontoxic, resting comfortably, vitals with mildly elevated blood pressure, doubt hypertensive emergency  Additional history obtained:  Additional history obtained from chart review & nursing note review.  Seen at behavioral health earlier today, plan for overnight observation and reevaluation by  psychiatry in the morning  Lab Tests:  Screening labs have been reviewed including CBC, CMP, acetaminophen/salicylate/ethanol level: Unremarkable  ED Course:  Placed on CIWA protocol- does not appear to require admission for alcohol withdrawal. Patient is medically cleared.   The patient has been placed in psychiatric observation due to the need to provide a safe environment for the patient while obtaining psychiatric consultation and evaluation, as well as ongoing medical and medication management to treat the patient's condition.  The patient has not been placed under full IVC at this time.  Portions of this note were generated with Scientist, clinical (histocompatibility and immunogenetics). Dictation errors may occur despite best attempts at proofreading.   Final Clinical Impression(s) / ED Diagnoses Final diagnoses:  Suicidal ideation    Rx / DC Orders ED Discharge Orders     None        Cherly Anderson, PA-C 03/26/21 2018    Arby Barrette, MD 04/24/21 1443

## 2021-03-27 ENCOUNTER — Inpatient Hospital Stay (HOSPITAL_COMMUNITY)
Admission: RE | Admit: 2021-03-27 | Discharge: 2021-04-03 | DRG: 885 | Disposition: A | Discharge status: Home / Self Care | Payer: Federal, State, Local not specified - Other | Source: Intra-hospital | Attending: Emergency Medicine | Admitting: Emergency Medicine

## 2021-03-27 ENCOUNTER — Encounter (HOSPITAL_COMMUNITY): Payer: Self-pay | Admitting: Psychiatry

## 2021-03-27 ENCOUNTER — Other Ambulatory Visit: Payer: Self-pay | Admitting: Psychiatry

## 2021-03-27 DIAGNOSIS — K219 Gastro-esophageal reflux disease without esophagitis: Secondary | ICD-10-CM | POA: Diagnosis present

## 2021-03-27 DIAGNOSIS — R45851 Suicidal ideations: Secondary | ICD-10-CM | POA: Diagnosis present

## 2021-03-27 DIAGNOSIS — T43596A Underdosing of other antipsychotics and neuroleptics, initial encounter: Secondary | ICD-10-CM | POA: Diagnosis present

## 2021-03-27 DIAGNOSIS — Z91138 Patient's unintentional underdosing of medication regimen for other reason: Secondary | ICD-10-CM

## 2021-03-27 DIAGNOSIS — Z560 Unemployment, unspecified: Secondary | ICD-10-CM

## 2021-03-27 DIAGNOSIS — R110 Nausea: Secondary | ICD-10-CM | POA: Diagnosis present

## 2021-03-27 DIAGNOSIS — F4310 Post-traumatic stress disorder, unspecified: Secondary | ICD-10-CM | POA: Diagnosis present

## 2021-03-27 DIAGNOSIS — I10 Essential (primary) hypertension: Secondary | ICD-10-CM | POA: Diagnosis present

## 2021-03-27 DIAGNOSIS — Y900 Blood alcohol level of less than 20 mg/100 ml: Secondary | ICD-10-CM | POA: Diagnosis present

## 2021-03-27 DIAGNOSIS — F1020 Alcohol dependence, uncomplicated: Secondary | ICD-10-CM | POA: Diagnosis present

## 2021-03-27 DIAGNOSIS — F3181 Bipolar II disorder: Secondary | ICD-10-CM | POA: Diagnosis present

## 2021-03-27 DIAGNOSIS — F1210 Cannabis abuse, uncomplicated: Secondary | ICD-10-CM

## 2021-03-27 DIAGNOSIS — Z79899 Other long term (current) drug therapy: Secondary | ICD-10-CM | POA: Diagnosis not present

## 2021-03-27 DIAGNOSIS — G4700 Insomnia, unspecified: Secondary | ICD-10-CM | POA: Diagnosis present

## 2021-03-27 DIAGNOSIS — F845 Asperger's syndrome: Secondary | ICD-10-CM | POA: Diagnosis present

## 2021-03-27 DIAGNOSIS — F1010 Alcohol abuse, uncomplicated: Secondary | ICD-10-CM | POA: Diagnosis present

## 2021-03-27 DIAGNOSIS — F840 Autistic disorder: Secondary | ICD-10-CM | POA: Diagnosis present

## 2021-03-27 DIAGNOSIS — F1410 Cocaine abuse, uncomplicated: Secondary | ICD-10-CM | POA: Diagnosis present

## 2021-03-27 DIAGNOSIS — F17210 Nicotine dependence, cigarettes, uncomplicated: Secondary | ICD-10-CM | POA: Diagnosis present

## 2021-03-27 DIAGNOSIS — F329 Major depressive disorder, single episode, unspecified: Secondary | ICD-10-CM | POA: Insufficient documentation

## 2021-03-27 MED ORDER — NICOTINE 21 MG/24HR TD PT24
21.0000 mg | MEDICATED_PATCH | Freq: Every day | TRANSDERMAL | Status: DC
  Administered 2021-03-27 – 2021-04-03 (×7): 21 mg via TRANSDERMAL
  Filled 2021-03-27 (×11): qty 1

## 2021-03-27 NOTE — Consult Note (Signed)
[facility] Face-to-Face Psychiatry Consult   Reason for Consult:  psych consult Referring Physician:  Harvie Heck PA-C Patient Identification: Shawn Ford MRN:  [id number] Principal Diagnosis: Suicidal ideations Diagnosis:  Principal Problem:   Suicidal ideations Active Problems:   Bipolar II disorder (HCC)   Alcohol abuse   History of Asperger's syndrome   Cocaine use disorder (HCC)   Total Time spent with patient: 20 minutes  Subjective:   Shawn Ford is a 30 y.o. male patient admitted with suicidal ideations.  "I guess I have to stop doing cocaine. Which I'm fine with because it's bad for you but it is pretty cool". Patient presents alert and oriented; calm, cooperative, and concrete. History of Asperger's noted. Patient able to engage in assessment; answer majority of the questions appropriately. Provider discussed substance use and it's effective mood/overall mental health. Patient expressed being "ready to leave" ED and "wanting to smoke a cigarette".   Patient denies any active suicidal or homicidal ideations, auditory or visual hallucinations, and does not appear to be responding to any external/internal stimuli at this time. Patient being reviewed by [facility] at this time. Patient provided verbal consent to speak to his mother.    Cristine Polio (mother) [phone] made aware of patient's acceptance to [facility].   HPI:   Shawn Ford is a 30 year old male with a history of Asperger's, bipolar II disorder, PTSD, alcohol abuse, cocaine use disorder, and suicidal ideations who presented to [facility] voluntarily as a walk-in for assessment of suicidal ideations. Patient reported "being up all night drinking" and thinking about his deceased father; he then reported using cocaine. Patient recently seen at [facility] (03/03/21) for similar presentation where he was observed overnight and discharged to follow-up with his outpatient provider at [facility]. Patient  reports not taking medication consistently and using illicit substances (cocaine, marijuana). UDS+marijuana.   Past Psychiatric History:   -Asperger's   -Cocaine use disorder  -Alcohol use disorder  Risk to Self:  pt denies Risk to Others:  pt denies Prior Inpatient Therapy:  pt denies Prior Outpatient Therapy:  yes  Past Medical History:  Past Medical History:  Diagnosis Date   Bipolar 1 disorder (HCC)     Past Surgical History:  Procedure Laterality Date   ORIF RADIAL FRACTURE Left 01/24/2017   Procedure: OPEN REDUCTION INTERNAL FIXATION (ORIF) RADIAL FRACTURE;  Surgeon: Dominica Severin, MD;  Location: [facility];  Service: Orthopedics;  Laterality: Left;   TYMPANOSTOMY TUBE PLACEMENT Bilateral 1995   Family History: History reviewed. No pertinent family history. Family Psychiatric  History: not noted Social History:  Social History   Substance and Sexual Activity  Alcohol Use Yes   Alcohol/week: 24.0 standard drinks   Types: 24 Cans of beer per week   Comment: case a day     Social History   Substance and Sexual Activity  Drug Use Yes   Types: Marijuana    Social History   Socioeconomic History   Marital status: Single    Spouse name: Not on file   Number of children: Not on file   Years of education: Not on file   Highest education level: Not on file  Occupational History   Not on file  Tobacco Use   Smoking status: Every Day    Types: Cigarettes   Smokeless tobacco: Never  Vaping Use   Vaping Use: Never used  Substance and Sexual Activity   Alcohol use: Yes    Alcohol/week: 24.0  standard drinks    Types: 24 Cans of beer per week    Comment: case a day   Drug use: Yes    Types: Marijuana   Sexual activity: Not on file  Other Topics Concern   Not on file  Social History Narrative   Not on file   Social Determinants of Health   Financial Resource Strain: Not on file  Food Insecurity: Not on file  Transportation Needs: Not on file  Physical  Activity: Not on file  Stress: Not on file  Social Connections: Not on file   Additional Social History:    Allergies:  No Known Allergies  Labs:  Results for orders placed or performed during the hospital encounter of 03/26/21 (from the past 48 hour(s))  Comprehensive metabolic panel     Status: Abnormal   Collection Time: 03/26/21  6:17 PM  Result Value Ref Range   Sodium 138 135 - 145 mmol/L   Potassium 3.5 3.5 - 5.1 mmol/L   Chloride 105 98 - 111 mmol/L   CO2 24 22 - 32 mmol/L   Glucose, Bld 105 (H) 70 - 99 mg/dL    Comment: Glucose reference range applies only to samples taken after fasting for at least 8 hours.   BUN 10 6 - 20 mg/dL   Creatinine, Ser 1.30 0.61 - 1.24 mg/dL   Calcium 9.5 8.9 - 86.5 mg/dL   Total Protein 7.7 6.5 - 8.1 g/dL   Albumin 4.8 3.5 - 5.0 g/dL   AST 15 15 - 41 U/L   ALT 15 0 - 44 U/L   Alkaline Phosphatase 72 38 - 126 U/L   Total Bilirubin 0.6 0.3 - 1.2 mg/dL   GFR, Estimated >78 >46 mL/min    Comment: (NOTE) Calculated using the CKD-EPI Creatinine Equation (2021)    Anion gap 9 5 - 15    Comment: Performed at [facility], [address], [place], [place] [address]  Ethanol     Status: None   Collection Time: 03/26/21  6:17 PM  Result Value Ref Range   Alcohol, Ethyl (B) <10 <10 mg/dL    Comment: (NOTE) Lowest detectable limit for serum alcohol is 10 mg/dL.  For medical purposes only. Performed at [facility], [address] [place], [place] [address]   Salicylate level     Status: Abnormal   Collection Time: 03/26/21  6:17 PM  Result Value Ref Range   Salicylate Lvl <7.0 (L) 7.0 - 30.0 mg/dL    Comment: Performed at [facility], [address], [place], [place] [address]  Acetaminophen level     Status: Abnormal   Collection Time: 03/26/21  6:17 PM  Result Value Ref Range   Acetaminophen (Tylenol), Serum <10 (L) 10 - 30 ug/mL    Comment: (NOTE) Therapeutic concentrations  vary significantly. A range of 10-30 ug/mL  may be an effective concentration for many patients. However, some  are best treated at concentrations outside of this range. Acetaminophen concentrations >150 ug/mL at 4 hours after ingestion  and >50 ug/mL at 12 hours after ingestion are often associated with  toxic reactions.  Performed at [facility], [address], [place], [place] [address]   cbc     Status: None   Collection Time: 03/26/21  6:17 PM  Result Value Ref Range   WBC 10.0 4.0 - 10.5 K/uL   RBC 5.14 4.22 - 5.81 MIL/uL   Hemoglobin 16.3 13.0 - 17.0  g/dL   HCT 16.146.6 09.639.0 - 04.552.0 %   MCV 90.7 80.0 - 100.0 fL   MCH 31.7 26.0 - 34.0 pg   MCHC 35.0 30.0 - 36.0 g/dL   RDW 40.913.5 81.111.5 - 91.415.5 %   Platelets 150 150 - 400 K/uL   nRBC 0.0 0.0 - 0.2 %    Comment: Performed at [facility], [address] [place], [place] [address]  Rapid urine drug screen (hospital performed)     Status: Abnormal   Collection Time: 03/26/21  8:20 PM  Result Value Ref Range   Opiates NONE DETECTED NONE DETECTED   Cocaine NONE DETECTED NONE DETECTED   Benzodiazepines NONE DETECTED NONE DETECTED   Amphetamines NONE DETECTED NONE DETECTED   Tetrahydrocannabinol POSITIVE (A) NONE DETECTED   Barbiturates NONE DETECTED NONE DETECTED    Comment: (NOTE) DRUG SCREEN FOR MEDICAL PURPOSES ONLY.  IF CONFIRMATION IS NEEDED FOR ANY PURPOSE, NOTIFY LAB WITHIN 5 DAYS.  LOWEST DETECTABLE LIMITS FOR URINE DRUG SCREEN Drug Class                     Cutoff (ng/mL) Amphetamine and metabolites    1000 Barbiturate and metabolites    200 Benzodiazepine                 200 Tricyclics and metabolites     300 Opiates and metabolites        300 Cocaine and metabolites        300 THC                            50 Performed at [facility], [address] [place], [place] [address]   Resp Panel by RT-PCR (Flu A&B, Covid) Urine, Clean Catch     Status: None    Collection Time: 03/26/21  8:20 PM   Specimen: Urine, Clean Catch; Nasopharyngeal(NP) swabs in vial transport medium  Result Value Ref Range   SARS Coronavirus 2 by RT PCR NEGATIVE NEGATIVE    Comment: (NOTE) SARS-CoV-2 target nucleic acids are NOT DETECTED.  The SARS-CoV-2 RNA is generally detectable in upper respiratory specimens during the acute phase of infection. The lowest concentration of SARS-CoV-2 viral copies this assay can detect is 138 copies/mL. A negative result does not preclude SARS-Cov-2 infection and should not be used as the sole basis for treatment or other patient management decisions. A negative result may occur with  improper specimen collection/handling, submission of specimen other than nasopharyngeal swab, presence of viral mutation(s) within the areas targeted by this assay, and inadequate number of viral copies(<138 copies/mL). A negative result must be combined with clinical observations, patient history, and epidemiological information. The expected result is Negative.  Fact Sheet for Patients:  BloggerCourse.comhttps://www.fda.gov/media/152166/download  Fact Sheet for Healthcare Providers:  SeriousBroker.ithttps://www.fda.gov/media/152162/download  This test is no t yet approved or cleared by the [place] FDA and  has been authorized for detection and/or diagnosis of SARS-CoV-2 by FDA under an Emergency Use Authorization (EUA). This EUA will remain  in effect (meaning this test can be used) for the duration of the COVID-19 declaration under Section 564(b)(1) of the Act, 21 U.S.C.section 360bbb-3(b)(1), unless the authorization is terminated  or revoked sooner.       Influenza A by PCR NEGATIVE NEGATIVE   Influenza B by PCR NEGATIVE NEGATIVE    Comment: (NOTE) The Xpert Xpress SARS-CoV-2/FLU/RSV plus assay is intended as an aid  in the diagnosis of influenza from Nasopharyngeal swab specimens and should not be used as a sole basis for treatment. Nasal washings and aspirates  are unacceptable for Xpert Xpress SARS-CoV-2/FLU/RSV testing.  Fact Sheet for Patients: BloggerCourse.com  Fact Sheet for Healthcare Providers: SeriousBroker.it  This test is not yet approved or cleared by the [place] FDA and has been authorized for detection and/or diagnosis of SARS-CoV-2 by FDA under an Emergency Use Authorization (EUA). This EUA will remain in effect (meaning this test can be used) for the duration of the COVID-19 declaration under Section 564(b)(1) of the Act, 21 U.S.C. section 360bbb-3(b)(1), unless the authorization is terminated or revoked.  Performed at [facility], [address], [place], [place] [address]     Current Facility-Administered Medications  Medication Dose Route Frequency Provider Last Rate Last Admin   acetaminophen (TYLENOL) tablet 650 mg  650 mg Oral Q4H PRN Petrucelli, Samantha R, PA-C       alum & mag hydroxide-simeth (MAALOX/MYLANTA) 200-200-20 MG/5ML suspension 30 mL  30 mL Oral Q6H PRN Petrucelli, Samantha R, PA-C       hydrOXYzine (ATARAX/VISTARIL) tablet 25 mg  25 mg Oral TID Petrucelli, Samantha R, PA-C   25 mg at 03/27/21 0958   LORazepam (ATIVAN) injection 0-4 mg  0-4 mg Intravenous Q6H Haskel Schroeder, PA-C       Or   LORazepam (ATIVAN) tablet 0-4 mg  0-4 mg Oral Q6H Haskel Schroeder, PA-C   1 mg at 03/26/21 1838   [START ON 03/29/2021] LORazepam (ATIVAN) injection 0-4 mg  0-4 mg Intravenous Q12H Haskel Schroeder, PA-C       Or   [START ON 03/29/2021] LORazepam (ATIVAN) tablet 0-4 mg  0-4 mg Oral Q12H Badalamente, Peter R, PA-C       nicotine (NICODERM CQ - dosed in mg/24 hours) patch 21 mg  21 mg Transdermal Once Arby Barrette, MD   21 mg at 03/26/21 1800   OLANZapine (ZYPREXA) tablet 10 mg  10 mg Oral QHS Petrucelli, Samantha R, PA-C   10 mg at 03/26/21 2122   ondansetron (ZOFRAN) tablet 4 mg  4 mg Oral Q8H PRN Petrucelli, Samantha R, PA-C        thiamine tablet 100 mg  100 mg Oral Daily Haskel Schroeder, PA-C   100 mg at 03/27/21 1478   Or   thiamine (B-1) injection 100 mg  100 mg Intravenous Daily Haskel Schroeder, PA-C       Current Outpatient Medications  Medication Sig Dispense Refill   hydrOXYzine (ATARAX/VISTARIL) 25 MG tablet Take 1 tablet (25 mg total) by mouth 3 (three) times daily. 90 tablet 0   OLANZapine (ZYPREXA) 10 MG tablet Take 1 tablet (10 mg total) by mouth at bedtime. 30 tablet 0    Musculoskeletal: Strength & Muscle Tone: within normal limits Gait & Station: normal Patient leans: N/A  Psychiatric Specialty Exam:  Presentation  General Appearance: Disheveled; Casual  Eye Contact:Fleeting  Speech:Pressured; Clear and Coherent  Speech Volume:Normal  Handedness: No data recorded  Mood and Affect  Mood:Dysphoric  Affect:Non-Congruent   Thought Process  Thought Processes:Irrevelant  Descriptions of Associations:Loose  Orientation:Partial  Thought Content:Scattered  History of Schizophrenia/Schizoaffective disorder:No  Duration of Psychotic Symptoms:N/A  Hallucinations:Hallucinations: None  Ideas of Reference:None  Suicidal Thoughts:Suicidal Thoughts: Yes, Active SI Active Intent and/or Plan: With Plan  Homicidal Thoughts:Homicidal Thoughts: No   Sensorium  Memory:Immediate Fair; Recent Fair; Remote Fair  Judgment:Impaired (history of Asperger's.)  Insight:Lacking;  Shallow; Other (comment) (history of Asperger's)   Executive Functions  Concentration:Fair  Attention Span:Fair  Recall:Fair  [employer] of Knowledge:Good  Language:Fair   Psychomotor Activity  Psychomotor Activity:Psychomotor Activity: Normal   Assets  Assets:Communication Skills; Desire for Improvement; Housing; Physical Health; Resilience; Social Support   Sleep  Sleep:Sleep: Poor (per patient report)   Physical Exam: Physical Exam Vitals and nursing note reviewed.  Constitutional:       General: He is not in acute distress.    Appearance: Normal appearance. He is normal weight. He is not ill-appearing, toxic-appearing or diaphoretic.  HENT:     Head: Normocephalic.     Nose: Nose normal.     Mouth/Throat:     Mouth: Mucous membranes are moist.     Pharynx: Oropharynx is clear.  Eyes:     Pupils: Pupils are equal, round, and reactive to light.  Cardiovascular:     Rate and Rhythm: Normal rate.     Pulses: Normal pulses.  Pulmonary:     Effort: Pulmonary effort is normal.     Breath sounds: Normal breath sounds.  Abdominal:     General: Abdomen is flat.  Musculoskeletal:        General: Normal range of motion.     Cervical back: Normal range of motion.  Skin:    General: Skin is warm.  Neurological:     General: No focal deficit present.     Mental Status: He is alert and oriented to person, place, and time. Mental status is at baseline.  Psychiatric:        Attention and Perception: Attention and perception normal.        Mood and Affect: Mood normal. Affect is flat.        Speech: Speech normal.        Behavior: Behavior is cooperative.        Thought Content: Thought content is not paranoid or delusional. Thought content does not include homicidal or suicidal ideation. Thought content does not include homicidal or suicidal plan.        Cognition and Memory: Memory normal.        Judgment: Judgment is impulsive.     Comments: History of Asperger's   Review of Systems  Psychiatric/Behavioral:  Positive for depression and substance abuse.   All other systems reviewed and are negative. Blood pressure (!) 133/97, pulse (!) 56, temperature 98.1 F (36.7 C), temperature source Oral, resp. rate 17, height 6\' 1"  (1.854 m), weight 72.6 kg, SpO2 94 %. Body mass index is 21.11 kg/m.  Treatment Plan Summary: Daily contact with patient to assess and evaluate symptoms and progress in treatment, Medication management, and Plan  patient admitted to [facility] 406-1 for  further observation, stabilization, and treatment of his depression and substance abuse.   Disposition: Recommend psychiatric Inpatient admission when medically cleared. Supportive therapy provided about ongoing stressors. Discussed crisis plan, support from social network, calling 911, coming to the Emergency Department, and calling Suicide Hotline.  [facility], NP 03/27/2021 12:48 PM

## 2021-03-27 NOTE — Progress Notes (Signed)
Patient ID: Shawn Ford, male   DOB: [birth year], 29 y.o.   MRN: [id number] Patient has been accepted to [facility] room 406-1. Call nurse-to-nurse report to [phone]. Patient may be transported to [facility] after 1400.
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
## 2021-03-27 NOTE — Progress Notes (Signed)
Pt continues to appear anxious, but cooperative.  Pt currently denies SI/ HI/AVH and pain to this writer during the shift. Pt interacts appropriately with others in the milieu. Pt verbally contracted for safety.   A: Support and encouragement was offered and accepted. Pt did not have medications scheduled nor required PRN medications. Pt removed nicotine patch. Safety rounds continued and maintained Q 15 throughout the shift.    R: Safety maintained. Will continue to monitor and assess.    03/27/21 2256  Psych Admission Type (Psych Patients Only)  Admission Status Voluntary  Psychosocial Assessment  Patient Complaints None  Eye Contact Brief  Facial Expression Anxious  Affect Anxious  Speech Logical/coherent  Interaction Assertive  Motor Activity Pacing  Appearance/Hygiene In scrubs  Behavior Characteristics Appropriate to situation;Cooperative  Mood Anxious  Thought Process  Coherency WDL  Content Other (Comment) (Unremarkable)  Delusions Controlled  Perception WDL  Hallucination None reported or observed  Judgment Limited  Confusion None  Danger to Self  Current suicidal ideation? Denies  Danger to Others  Danger to Others None reported or observed

## 2021-03-27 NOTE — Tx Team (Signed)
Initial Treatment Plan 03/27/2021 2:49 PM SANTOS SOLLENBERGER WOE:[id number]    PATIENT STRESSORS: Health problems Loss of father Marital or family conflict Substance abuse   PATIENT STRENGTHS: Ability for insight Average or above average intelligence General fund of knowledge Motivation for treatment/growth Physical Health Religious Affiliation Supportive family/friends Work skills   PATIENT IDENTIFIED PROBLEMS: anxiety  depression  Suicidal ideations  Substance use/abuse               DISCHARGE CRITERIA:  Ability to meet basic life and health needs Improved stabilization in mood, thinking, and/or behavior Motivation to continue treatment in a less acute level of care Need for constant or close observation no longer present  PRELIMINARY DISCHARGE PLAN: Attend aftercare/continuing care group Attend 12-step recovery group Outpatient therapy Return to previous living arrangement  PATIENT/FAMILY INVOLVEMENT: This treatment plan has been presented to and reviewed with the patient, EZEKIAH MASSIE.  The patient and family have been given the opportunity to ask questions and make suggestions.  Raylene Miyamoto, RN 03/27/2021, 2:49 PM

## 2021-03-27 NOTE — ED Notes (Signed)
Pt given breakfast tray

## 2021-03-27 NOTE — Progress Notes (Signed)
Adult Psychoeducational Group Note  Date:  03/27/2021 Time:  9:49 PM  Group Topic/Focus:  Wrap-Up Group:   The focus of this group is to help patients review their daily goal of treatment and discuss progress on daily workbooks.  Participation Level:  Active  Participation Quality:  Appropriate  Affect:  Appropriate  Cognitive:  Appropriate  Insight: Appropriate  Engagement in Group:  Engaged  Modes of Intervention:  Discussion  Additional Comments:  Patient attend wrap up group  [name] 03/27/2021, 9:49 PM

## 2021-03-28 DIAGNOSIS — F3181 Bipolar II disorder: Secondary | ICD-10-CM | POA: Diagnosis not present

## 2021-03-28 DIAGNOSIS — F840 Autistic disorder: Secondary | ICD-10-CM | POA: Diagnosis present

## 2021-03-28 DIAGNOSIS — F1210 Cannabis abuse, uncomplicated: Secondary | ICD-10-CM

## 2021-03-28 MED ORDER — LOPERAMIDE HCL 2 MG PO CAPS: 2.0000 mg | ORAL_CAPSULE | ORAL | Status: AC | PRN

## 2021-03-28 MED ORDER — LORAZEPAM 1 MG PO TABS
1.0000 mg | ORAL_TABLET | Freq: Four times a day (QID) | ORAL | Status: AC | PRN
  Administered 2021-03-29: 1 mg via ORAL
  Filled 2021-03-28: qty 1

## 2021-03-28 MED ORDER — ONDANSETRON 4 MG PO TBDP
4.0000 mg | ORAL_TABLET | Freq: Four times a day (QID) | ORAL | Status: AC | PRN
  Administered 2021-03-30: 4 mg via ORAL
  Filled 2021-03-28: qty 1

## 2021-03-28 MED ORDER — THIAMINE HCL 100 MG PO TABS
100.0000 mg | ORAL_TABLET | Freq: Every day | ORAL | Status: DC
  Administered 2021-03-29 – 2021-04-03 (×6): 100 mg via ORAL
  Filled 2021-03-28 (×9): qty 1

## 2021-03-28 MED ORDER — ADULT MULTIVITAMIN W/MINERALS CH
1.0000 | ORAL_TABLET | Freq: Every day | ORAL | Status: DC
  Administered 2021-03-28 – 2021-04-03 (×7): 1 via ORAL
  Filled 2021-03-28 (×9): qty 1

## 2021-03-28 MED ORDER — OLANZAPINE 10 MG PO TBDP
10.0000 mg | ORAL_TABLET | Freq: Every day | ORAL | Status: DC
  Administered 2021-03-28: 10 mg via ORAL
  Filled 2021-03-28 (×3): qty 1

## 2021-03-28 MED ORDER — HYDROXYZINE HCL 25 MG PO TABS
25.0000 mg | ORAL_TABLET | Freq: Four times a day (QID) | ORAL | Status: AC | PRN
  Administered 2021-03-28 – 2021-03-30 (×3): 25 mg via ORAL
  Filled 2021-03-28 (×3): qty 1
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
## 2021-03-28 NOTE — Progress Notes (Signed)
Pt visible on the unit some, pt given PRN Vistaril per MAR with HS medication    03/28/21 2200  Psych Admission Type (Psych Patients Only)  Admission Status Voluntary  Psychosocial Assessment  Patient Complaints None  Eye Contact Brief  Facial Expression Anxious  Affect Anxious  Speech Logical/coherent  Interaction Assertive  Motor Activity Pacing  Appearance/Hygiene In scrubs  Thought Process  Coherency WDL  Content Other (Comment) (Unremarkable)  Delusions Controlled  Perception WDL  Hallucination None reported or observed  Judgment Limited  Confusion None  Danger to Self  Current suicidal ideation? Denies  Danger to Others  Danger to Others None reported or observed

## 2021-03-28 NOTE — [facility] Group Notes (Addendum)
Type of Therapy and Topic:  Group Therapy - Healthy vs Unhealthy Coping Skills  Participation Level:  Active   Description of Group The focus of this group was to determine what unhealthy coping techniques typically are used by group members and what healthy coping techniques would be helpful in coping with various problems. Patients were guided in becoming aware of the differences between healthy and unhealthy coping techniques. Patients were asked to identify 2-3 healthy coping skills they would like to learn to use more effectively.  Therapeutic Goals Patients learned that coping is what human beings do all day long to deal with various situations in their lives Patients defined and discussed healthy vs unhealthy coping techniques Patients identified their preferred coping techniques and identified whether these were healthy or unhealthy Patients determined 2-3 healthy coping skills they would like to become more familiar with and use more often. Patients provided support and ideas to each other   Summary of Patient Progress:  During group, Shawn Ford expressed that smoking Cigarettes is one of his coping skills. Patient proved open to input from peers and feedback from CSW. Patient demonstrated insight into the subject matter, was respectful of peers, and participated throughout the entire session.  The Patient discussed things that are in their control, out of their control, how they cope with those situations, and the possible positive and negative consequences that come with their actions.

## 2021-03-28 NOTE — Tx Team (Signed)
Interdisciplinary Treatment and Diagnostic Plan Update  03/28/2021 Time of Session: 9:50am Shawn Ford MRN: [id number]  Principal Diagnosis: <principal problem not specified>  Secondary Diagnoses: Active Problems:   MDD (major depressive disorder)   Current Medications:  Current Facility-Administered Medications  Medication Dose Route Frequency Provider Last Rate Last Admin   nicotine (NICODERM CQ - dosed in mg/24 hours) patch 21 mg  21 mg Transdermal Daily Nelda Marseille, Amy E, MD   21 mg at 03/28/21 0759   PTA Medications: Medications Prior to Admission  Medication Sig Dispense Refill Last Dose   hydrOXYzine (ATARAX/VISTARIL) 25 MG tablet Take 1 tablet (25 mg total) by mouth 3 (three) times daily. 90 tablet 0    OLANZapine (ZYPREXA) 10 MG tablet Take 1 tablet (10 mg total) by mouth at bedtime. 30 tablet 0     Patient Stressors: Health problems Loss of father Marital or family conflict Substance abuse  Patient Strengths: Ability for insight Average or above average intelligence General fund of knowledge Motivation for treatment/growth Physical Health Religious Affiliation Supportive family/friends Work skills  Treatment Modalities: Medication Management, Group therapy, Case management,  1 to 1 session with clinician, Psychoeducation, Recreational therapy.   Physician Treatment Plan for Primary Diagnosis: <principal problem not specified> Long Term Goal(s):     Short Term Goals:    Medication Management: Evaluate patient's response, side effects, and tolerance of medication regimen.  Therapeutic Interventions: 1 to 1 sessions, Unit Group sessions and Medication administration.  Evaluation of Outcomes: Not Met  Physician Treatment Plan for Secondary Diagnosis: Active Problems:   MDD (major depressive disorder)  Long Term Goal(s):     Short Term Goals:       Medication Management: Evaluate patient's response, side effects, and tolerance of medication  regimen.  Therapeutic Interventions: 1 to 1 sessions, Unit Group sessions and Medication administration.  Evaluation of Outcomes: Not Met   RN Treatment Plan for Primary Diagnosis: <principal problem not specified> Long Term Goal(s): Knowledge of disease and therapeutic regimen to maintain health will improve  Short Term Goals: Ability to remain free from injury will improve, Ability to verbalize frustration and anger appropriately will improve, Ability to participate in decision making will improve, Ability to verbalize feelings will improve, Ability to identify and develop effective coping behaviors will improve, and Compliance with prescribed medications will improve  Medication Management: RN will administer medications as ordered by provider, will assess and evaluate patient's response and provide education to patient for prescribed medication. RN will report any adverse and/or side effects to prescribing provider.  Therapeutic Interventions: 1 on 1 counseling sessions, Psychoeducation, Medication administration, Evaluate responses to treatment, Monitor vital signs and CBGs as ordered, Perform/monitor CIWA, COWS, AIMS and Fall Risk screenings as ordered, Perform wound care treatments as ordered.  Evaluation of Outcomes: Not Met   LCSW Treatment Plan for Primary Diagnosis: <principal problem not specified> Long Term Goal(s): Safe transition to appropriate next level of care at discharge, Engage patient in therapeutic group addressing interpersonal concerns.  Short Term Goals: Engage patient in aftercare planning with referrals and resources, Increase social support, Increase ability to appropriately verbalize feelings, Increase emotional regulation, Identify triggers associated with mental health/substance abuse issues, and Increase skills for wellness and recovery  Therapeutic Interventions: Assess for all discharge needs, 1 to 1 time with Social worker, Explore available resources and  support systems, Assess for adequacy in community support network, Educate family and significant other(s) on suicide prevention, Complete Psychosocial Assessment, Interpersonal group therapy.  Evaluation of Outcomes:  Not Met   Progress in Treatment: Attending groups: Yes. Participating in groups: Yes. Taking medication as prescribed: Yes. Toleration medication: Yes. Family/Significant other contact made: No, will contact:  if consent is provided Patient understands diagnosis: Yes. Discussing patient identified problems/goals with staff: Yes. Medical problems stabilized or resolved: Yes. Denies suicidal/homicidal ideation: Yes. Issues/concerns per patient self-inventory: No.   New problem(s) identified: No, Describe:  none  New Short Term/Long Term Goal(s): detox, medication management for mood stabilization; elimination of SI thoughts; development of comprehensive mental wellness/sobriety plan   Patient Goals: Did not attend   Discharge Plan or Barriers: Patient recently admitted. CSW will continue to follow and assess for appropriate referrals and possible discharge planning.    Reason for Continuation of Hospitalization: Anxiety Depression Medication stabilization Suicidal ideation Withdrawal symptoms  Estimated Length of Stay: 3-5 days  Attendees: Patient: Did not attend 03/28/2021   Physician: Fatima Sanger, DO 03/28/2021   Nursing:  03/28/2021   RN Care Manager: 03/28/2021   Social Worker: Shawn Moll, LCSW 03/28/2021   Recreational Therapist:  03/28/2021   Other:  03/28/2021   Other:  03/28/2021   Other: 03/28/2021     Scribe for Treatment Team: Vassie Moselle, LCSW 03/28/2021 10:56 AM
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
## 2021-03-28 NOTE — [facility] Group Notes (Signed)
Occupational Therapy Group Note Date: 03/28/2021 Group Topic/Focus: Coping Skills  Group Description: Group Description: Group encouraged increased engagement and participation through discussion focused on coping through music. Group members were encouraged to create a "coping skills playlist" that consisted of 20 songs with different cited categories/topics including "a song that reminds you of a good memory" and "a song that makes you want to dance", etc. Discussion followed with patients sharing their playlists and choosing one for the group to listen and respond too.   Therapeutic Goal(s): Identify positive songs to improve coping through music Identify and share benefit of music as a coping strategy  Participation Level: Active   Participation Quality: Independent   Behavior: Calm, Cooperative, and Interactive   Speech/Thought Process: Focused   Affect/Mood: Euthymic   Insight: Fair   Judgement: Fair   Individualization: [name] was active in their participation of group discussion/activity. Pt identified benefit of music as a coping skill "It keeps me balanced".   Modes of Intervention: Activity, Discussion, and Education  Patient Response to Interventions:  Attentive, Engaged, and Interested   Plan: Continue to engage patient in OT groups 2 - 3x/week.  03/28/2021  Donne Hazel, MOT, OTR/L
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
## 2021-03-28 NOTE — [facility] Group Notes (Signed)
[facility] Group Notes:  (Nursing/MHT/Case Management/Adjunct)  Date:  03/28/2021  Time:  11:23 PM  Type of Therapy:  Group Therapy  Participation Level:  Active  Participation Quality:  Attentive  Affect:  Appropriate  Cognitive:  Appropriate  Insight:  Improving  Engagement in Group:  Developing/Improving  Modes of Intervention:  Discussion  Summary of Progress/Problems:  Lorita Officer 03/28/2021, 11:23 PM

## 2021-03-29 LAB — RESP PANEL BY RT-PCR (FLU A&B, COVID) ARPGX2
Influenza A by PCR: NEGATIVE
Influenza B by PCR: NEGATIVE
SARS Coronavirus 2 by RT PCR: NEGATIVE

## 2021-03-29 MED ORDER — RISPERIDONE 2 MG PO TABS
2.0000 mg | ORAL_TABLET | Freq: Every day | ORAL | Status: DC
  Administered 2021-03-29 – 2021-03-30 (×2): 2 mg via ORAL
  Filled 2021-03-29 (×3): qty 1

## 2021-03-29 MED ORDER — RISPERIDONE 1 MG PO TABS
1.0000 mg | ORAL_TABLET | Freq: Every day | ORAL | Status: DC
  Administered 2021-03-29 – 2021-03-31 (×3): 1 mg via ORAL
  Filled 2021-03-29 (×7): qty 1

## 2021-03-29 NOTE — Progress Notes (Signed)
[facility]  03/29/2021 4:47 PM Shawn Ford Roundy  MRN:  [id number]  CC: SI and delusions  Subjective:  Shawn Ford Shawn Ford is a 30 y.o. male, with reported past psychiatric history of bipolar 2 disorder, PTSD, alcohol abuse, marijuana abuse and autism spectrum disorder. Patient presents voluntarily to [facility] (03/27/2021), transferred from [facility] ED after medical clearance for SI. [facility] stay day 2.   24hr events:  The patient's chart was reviewed and nursing notes were reviewed. The patient's case was discussed in multidisciplinary team meeting.  Per MAR: - Patient is compliant with scheduled meds. - PRNs: Hydroxyzine Per RN notes, patient has been active in attending groups. Patient slept Number of Hours: 6.75.   Today (03/29/2021): The patient was seen and evaluated on the unit.  On assessment today the patient reported reported that he is bored and that he wants to go home.  Patient stated that the medicine has helped him sleep, and that he feels a bit drowsy today.  Patient stated that his appetite is "good".  Patient perseverates still on his alcoholism, tobacco abuse, and marijuana abuse.  Patient stated that his family is biased against him, stated that " we are [place], we drink like a fish, I am singled out because I am the pothead".  When asked if patient feels watched, patient stated that "yeah, cameras are always on, they are everywhere and I get caught doing stupid stuff all the time".  Patient denied SI/HI/AVH, first rank symptoms. When discussed the conversation that this Thereasa Parkinauthor had with the patient's mom.  Reported to patient that his mom was concerned because he reported to her that he was having SI and AVH.  Patient became irritable, and stated that his parents are liars and that people are spreading rumors about him.  Stated that he has never endorsed SI in his life.   Principal Problem: Bipolar II disorder (HCC) Diagnosis: Principal Problem:    Bipolar II disorder (HCC) Active Problems:   PTSD (post-traumatic stress disorder)   Alcohol abuse   Marijuana abuse   Autism spectrum disorder  Total Time spent with patient: 20 minutes  Past Psychiatric History:  Previous psych diagnosis: Bipolar 2, PTSD, Asperger syndrome Current psych meds: Olanzapine, patient has not been compliant however Past suicide attempts: None Past homicidal behavior: None Past nonsuicidal self-harm: None Psych hospitalizations: None, this is her first Psych outpatient: Patient [facility] Past psych med trials: Lithium, Depakote, Tegretol, Zoloft, olanzapine  Past Medical History:  Past Medical History:  Diagnosis Date   Bipolar 1 disorder (HCC)     Past Surgical History:  Procedure Laterality Date   ORIF RADIAL FRACTURE Left 01/24/2017   Procedure: OPEN REDUCTION INTERNAL FIXATION (ORIF) RADIAL FRACTURE;  Surgeon: Dominica SeverinGramig, William, MD;  Location: [facility] ORS;  Service: Orthopedics;  Laterality: Left;   TYMPANOSTOMY TUBE PLACEMENT Bilateral 1995   Family History: History reviewed. No pertinent family history. Family Psychiatric  History:  Mother reported that deceased father had similar presenting symptoms to patient in regards to mania and aggressive verbal outbursts  Social History:  Social History   Substance and Sexual Activity  Alcohol Use Yes   Alcohol/week: 24.0 standard drinks   Types: 24 Cans of beer per week   Comment: case a day     Social History   Substance and Sexual Activity  Drug Use Yes   Types: Marijuana, Cocaine    Social History   Socioeconomic History  Marital status: Single    Spouse name: Not on file   Number of children: Not on file   Years of education: Not on file   Highest education level: Not on file  Occupational History   Not on file  Tobacco Use   Smoking status: Every Day    Packs/day: 2.00    Types: Cigarettes   Smokeless tobacco: Never  Vaping Use   Vaping Use:  Never used  Substance and Sexual Activity   Alcohol use: Yes    Alcohol/week: 24.0 standard drinks    Types: 24 Cans of beer per week    Comment: case a day   Drug use: Yes    Types: Marijuana, Cocaine   Sexual activity: Not Currently  Other Topics Concern   Not on file  Social History Narrative   Not on file   Social Determinants of Health   Financial Resource Strain: Not on file  Food Insecurity: Not on file  Transportation Needs: Not on file  Physical Activity: Not on file  Stress: Not on file  Social Connections: Not on file   Additional Social History:  Patient currently lives in an apartment with 3 other roommates.  However patient stated that his roommates are kicking him out.  Patient is currently unemployed, but was left of a large sum of money from his deceased father.  Sleep: Fair  Appetite:  Good  Current Medications: Current Facility-Administered Medications  Medication Dose Route Frequency Provider Last Rate Last Admin   hydrOXYzine (ATARAX/VISTARIL) tablet 25 mg  25 mg Oral Q6H PRN Princess Bruins, DO   25 mg at 03/28/21 2114   loperamide (IMODIUM) capsule 2-4 mg  2-4 mg Oral PRN Princess Bruins, DO       LORazepam (ATIVAN) tablet 1 mg  1 mg Oral Q6H PRN Princess Bruins, DO   1 mg at 03/29/21 1235   multivitamin with minerals tablet 1 tablet  1 tablet Oral Daily Princess Bruins, DO   1 tablet at 03/29/21 1093   nicotine (NICODERM CQ - dosed in mg/24 hours) patch 21 mg  21 mg Transdermal Daily Mason Jim, Amy E, MD   21 mg at 03/29/21 0821   ondansetron (ZOFRAN-ODT) disintegrating tablet 4 mg  4 mg Oral Q6H PRN Princess Bruins, DO       risperiDONE (RISPERDAL) tablet 1 mg  1 mg Oral Daily Princess Bruins, DO   1 mg at 03/29/21 1616   And   risperiDONE (RISPERDAL) tablet 2 mg  2 mg Oral QHS Princess Bruins, DO       thiamine tablet 100 mg  100 mg Oral Daily Princess Bruins, DO   100 mg at 03/29/21 2355    Lab Results: No results found for this or any previous visit (from the  past 48 hour(s)).  Blood Alcohol level:  Lab Results  Component Value Date   ETH <10 03/26/2021   ETH <10 03/03/2021    Metabolic Disorder Labs: Lab Results  Component Value Date   HGBA1C 5.5 03/03/2021   MPG 111.15 03/03/2021   No results found for: PROLACTIN Lab Results  Component Value Date   CHOL 142 03/03/2021   TRIG 84 03/03/2021   HDL 44 03/03/2021   CHOLHDL 3.2 03/03/2021   VLDL 17 03/03/2021   LDLCALC 81 03/03/2021    Physical Findings: AIMS: Facial and Oral Movements Muscles of Facial Expression: None, normal Lips and Perioral Area: None, normal Jaw: None, normal Tongue: None, normal,Extremity Movements Upper (arms, wrists, hands, fingers):  None, normal Lower (legs, knees, ankles, toes): None, normal, Trunk Movements Neck, shoulders, hips: None, normal, Overall Severity Severity of abnormal movements (highest score from questions above): None, normal Incapacitation due to abnormal movements: None, normal Patient's awareness of abnormal movements (rate only patient's report): No Awareness, Dental Status Current problems with teeth and/or dentures?: No Does patient usually wear dentures?: No  CIWA:  CIWA-Ar Total: 0  Musculoskeletal: Strength & Muscle Tone: within normal limits Gait & Station: normal, steady Patient leans: N/A  Psychiatric Specialty Exam:  Presentation  General Appearance: Appropriate for Environment; Casual; Fairly Groomed (Adequate hygeine)   Eye Contact:Fair  Speech:Clear and Coherent (Rapid speech)  Speech Volume:Normal  Handedness:Right   Mood and Affect  Mood:Irritable; Labile; Dysphoric  Affect:Congruent   Thought Process  Thought Processes:Goal Directed  Descriptions of Associations:Circumstantial  Orientation:Full (Time, Place and Person)  Thought Content:Perseveration; Delusions; Paranoid Ideation (Patient perseverates on his father's death, alcohol abuse, marijuana abuse.  Patient endorsed dellusions that  people and parents spread lies about him and that there are cameras everywhere watching.)   History of Schizophrenia/Schizoaffective disorder:No  Duration of Psychotic Symptoms:N/A  Hallucinations:Hallucinations: None  Ideas of Reference:Paranoia; Percusatory (Discription above)  Suicidal Thoughts:Suicidal Thoughts: No  Homicidal Thoughts:Homicidal Thoughts: No   Sensorium  Memory:Immediate Good; Recent Fair; Remote Fair  Judgment:Impaired  Insight:Poor   Executive Functions  Concentration:Good  Attention Span:Fair  Recall:Fair  Fund of Knowledge:Fair  Language:Good   Psychomotor Activity  Psychomotor Activity:Psychomotor Activity: Normal   Assets  Assets:Desire for Improvement; Housing; Social Support   Sleep  Sleep:Sleep: Fair Number of Hours of Sleep: 6.75    Physical Exam: Physical Exam Vitals and nursing note reviewed.  HENT:     Head: Normocephalic.  Pulmonary:     Effort: Pulmonary effort is normal.  Neurological:     Mental Status: He is alert and oriented to person, place, and time.   Review of Systems  Respiratory:  Negative for shortness of breath.   Cardiovascular:  Negative for chest pain.  Gastrointestinal:  Negative for abdominal pain, constipation, diarrhea, nausea and vomiting.  Neurological:  Negative for dizziness and headaches.  Blood pressure (!) 153/107, pulse 88, temperature 98.1 F (36.7 C), temperature source Oral, resp. rate 18, height 5' 9.5" (1.765 m), weight 71.7 kg, SpO2 100 %. Body mass index is 23 kg/m.   Treatment Plan Summary: Daily contact with patient to assess and evaluate symptoms and progress in treatment and Medication management  Shawn Ford is a 30 y.o. male, with reported past psychiatric history of bipolar 2 disorder, PTSD, alcohol abuse, marijuana abuse and autism spectrum disorder. Patient presents voluntarily to [facility] (03/27/2021), transferred from [facility] ED after  medical clearance for SI. [facility] stay day 2.     ASSESSMENT:  Diagnosis:  - Bipolar disorder - PTSD - Polysubstance abuse - Autism spectrum disorder  TODAY (03/29/2021): Patient was seen sitting on his bed.  Compared to yesterday, patient seemed to have slowed down a bit, although his speech is still mildly rapid.  Patient continues to adamantly deny SI/AVH and ever coming to [facility] on 03/03/2021.   PSYCHIATRIC DIAGNOSIS & TREATMENT:  Bipolar disorder Discontinue home Zyprexa 10 mg p.o. nightly Start Risperdal 1 mg p.o. daily and Risperdal 2 mg p.o. nightly             CBC within normal limits (8/6)             CMP: glc 105, otherwise within normal limits (8/6)  Lipid panel within normal limits (7/14)             Hb A1c 5.5 (7/14)  EKG active      Polysubstance abuse (alcohol, marijuana, tobacco) Nicotine 21 mg patches Thiamine 100 milligrams p.o. daily Multivitamin daily Discussed risk and encouraged cessation.             BAL <10 (8/6)             UDS positive for THC (8/6)    PRN's -CIWA protocol -Trazadone 50mg  PO qHS PRN for insomnia -Hydroxyzine 25mg  PO TID PRN for anxiety -Alum & mag hydroxide-simeth 36ml PO qHS PRN for GERD -Magnesium hydroxide 10ml PO daily PRN for constipation -Acetaminophen tablet 650mg  PO PRN q6hrs for mild pain  -Imodium 2 to 4 mg PO PRN for diarrhea or loose stools -Zofran 4 mg PO q6hrs PRN  Discharge Planning: -Social work and case management to assist with discharge planning and identification of hospital follow-up needs prior to discharge -Estimated LOS: 3-4 days -Discharge Concerns: Need to establish a safety plan; Medication compliance and effectiveness -Discharge Goals: Return home with outpatient referrals for mental health follow-up including medication management/psychotherapy  Safety and Monitoring: -Voluntary admission to inpatient psychiatric unit for safety, stabilization and treatment -Daily contact with patient to assess  and evaluate symptoms and progress in treatment -Patient's case to be discussed in multi-disciplinary team meeting -Observation Level : q15 minute checks -Vital signs: q12 hours -Precautions: suicide, elopement, and assault   31m, DO, PGY-1 03/29/2021, 4:47 PM

## 2021-03-29 NOTE — Progress Notes (Signed)
The patient expressed in group that today was his birthday and that this is his first year after losing his father. His goal for tomorrow is to go home.

## 2021-03-29 NOTE — [facility] Group Notes (Signed)
Spiritual care group on grief and loss facilitated by chaplain Dyanne Carrel, [facility]   Group Goal:   Support / Education around grief and loss   Members engage in facilitated group support and psycho-social education.   Group Description:   Following introductions and group rules, group members engaged in facilitated group dialog and support around topic of loss, with particular support around experiences of loss in their lives. Group Identified types of loss (relationships / self / things) and identified patterns, circumstances, and changes that precipitate losses. Reflected on thoughts / feelings around loss, normalized grief responses, and recognized variety in grief experience. Group noted Worden's four tasks of grief in discussion.   Group drew on Adlerian / Rogerian, narrative, [place],   Patient Progress: Shawn Ford attended group and showed active participation.  He has some significant anger about a recent loss, but he stated that the group was really helpful.  Chaplain Dyanne Carrel, [facility] Pager, [phone] 11:57 AM

## 2021-03-29 NOTE — Progress Notes (Signed)
Pt has been alert and oriented to person, place, time but not to situation. Pt c/o withdrawal symptoms from alcohol and benzos and said it had been 2 days ago, CIWA=12. Pt given PRN Ativan PO for those c/os' with good effect. It was discussed with pt's MD Dr. Almira Bar that pt reports delusional thought content related to his withdrawal symptoms and drug use and it is difficult at this point to tell what is reality and what is delusional. Pt denies SI/HI/AVH. Pt is hyperverbal, hyperactive, speech is pressured. Pt has poor concentration. Pt has been compliant with unit routines, appetite is good, pt spends time  organizing his room. Will continue to monitor pt per Q15 minute face checks and monitor for safety and progress.

## 2021-03-29 NOTE — [facility] Suicide Risk Assessment (Signed)
[facility] INPATIENT:  Family/Significant Other Suicide Prevention Education  Suicide Prevention Education:  Education Completed; Cristine Polio [phone] (Mother) has been identified by the patient as the family member/significant other with whom the patient will be residing, and identified as the person(s) who will aid the patient in the event of a mental health crisis (suicidal ideations/suicide attempt).  With written consent from the patient, the family member/significant other has been provided the following suicide prevention education, prior to the and/or following the discharge of the patient.  The suicide prevention education provided includes the following: Suicide risk factors Suicide prevention and interventions National Suicide Hotline telephone number [facility] assessment telephone number [facility] Emergency Assistance 911 [facility] and/or Residential Mobile Crisis Unit telephone number  Request made of family/significant other to: Remove weapons (e.g., guns, rifles, knives), all items previously/currently identified as safety concern.   Remove drugs/medications (over-the-counter, prescriptions, illicit drugs), all items previously/currently identified as a safety concern.  The family member/significant other verbalizes understanding of the suicide prevention education information provided.  The family member/significant other agrees to remove the items of safety concern listed above.  CSW spoke with Mrs. Manson Passey who states that her son will be staying her her or his aunt and uncle at discharge but will need resources to help him find his own housing that will be long term.  She states that if he is discharged this coming weekend it will be with his aunt and uncle because she and his father are going out of town. Mrs. Manson Passey states that her son will have insurance that begins on 04/21/2021 while she is working on setting up Medicaid for him.  She states that she  would also like a referral to [facility] for her son so he can get additional services including employment support.  Mrs. Manson Passey states that her son was admitted to the hospital because he was hearing voices, having suicidal thoughts, and not taking his medications.  She confirms that he was being seen at [facility] but stopped going once he lost his insurance.  She states that she will pay for him to return there until he gets his insurance.  Mrs. Manson Passey states that there are no firearms or weapons in her son's possession.  CSW completed SPE with Mrs. Manson Passey.   [employer] [place] 03/29/2021, 2:17 PM

## 2021-03-29 NOTE — Progress Notes (Signed)
Pt visible on the unit, pt appears to be minimizing some sx, but pt has been pleasant on the unit this evening. Pt given PRN Vistaril with HS medication. EKG done    03/29/21 2300  Psych Admission Type (Psych Patients Only)  Admission Status Voluntary  Psychosocial Assessment  Eye Contact Brief  Facial Expression Anxious  Affect Anxious  Speech Logical/coherent  Interaction Assertive  Motor Activity Pacing  Appearance/Hygiene In scrubs  Behavior Characteristics Cooperative  Mood Anxious  Thought Process  Coherency WDL  Content WDL  Delusions Controlled  Perception WDL  Hallucination None reported or observed  Judgment Limited  Confusion None  Danger to Self  Current suicidal ideation? Denies  Danger to Others  Danger to Others None reported or observed

## 2021-03-29 NOTE — [facility] Group Notes (Signed)
Adult Psychoeducational Group Note  Date:  03/29/2021 Time:  11:27 AM  Group Topic/Focus:  Goals Group:   The focus of this group is to help patients establish daily goals to achieve during treatment and discuss how the patient can incorporate goal setting into their daily lives to aide in recovery.  Participation Level:  Active  Participation Quality:  Appropriate  Affect:  Appropriate  Cognitive:  Appropriate  Insight: Appropriate  Engagement in Group:  Engaged  Modes of Intervention:  Discussion  Additional Comments:   Patient attended goals group and stayed appropriate and engaged the duration of the group. Patient's goal was to get a discharge plan.  Jaxiel Kines T Chantal Worthey 03/29/2021, 11:27 AM

## 2021-03-29 NOTE — [facility] Group Notes (Deleted)
Adult Psychoeducational Group Note  Date:  03/29/2021 Time:  11:33 AM  Group Topic/Focus:  Goals Group:   The focus of this group is to help patients establish daily goals to achieve during treatment and discuss how the patient can incorporate goal setting into their daily lives to aide in recovery.  Participation Level:  Active  Participation Quality:  Appropriate  Affect:  Appropriate  Cognitive:  Appropriate  Insight: Appropriate  Engagement in Group:  Engaged  Modes of Intervention:  Discussion  Additional Comments:   Patient attended goals group and stayed appropriate and engaged the duration of the group. Patient's goal was to get a discharge plan.   Latha Staunton T Brittini Brubeck 03/29/2021, 11:33 AM

## 2021-03-30 MED ORDER — CLONIDINE HCL 0.1 MG PO TABS
0.1000 mg | ORAL_TABLET | Freq: Once | ORAL | Status: AC
  Administered 2021-03-30: 0.1 mg via ORAL
  Filled 2021-03-30 (×2): qty 1

## 2021-03-30 NOTE — Progress Notes (Signed)
[facility]  03/30/2021 11:32 AM Shawn KussmaulJoseph G Ford  MRN:  [id number]  CC: SI and delusions  Subjective:  Shawn KussmaulJoseph G Ford is a 30 y.o. male, with reported past psychiatric history of bipolar 2 disorder, PTSD, alcohol abuse, marijuana abuse and autism spectrum disorder. Patient [facility] ([facility]), transferred from [facility] ED after medical clearance for SI. [facility] stay day 3.   24hr events:  The patient's chart was reviewed and nursing notes were reviewed. The patient's case was discussed in multidisciplinary team meeting.  Per MAR: - Patient is compliant with scheduled meds. - PRNs: Hydroxyzine and Ativan 1 mg p.o. for CIWA of 12 at 12:01 on 03/29/2021 Per RN notes, patient has been active in attending groups.  Patient has also been providing conflicting history and symptoms to various staff members. Patient slept Number of Hours: 6.75.   Today (03/30/2021): The patient was seen and evaluated on the unit.  On assessment today the patient reported reported that he is bored and that he wants to go home.  Patient immediately blurted out that "I am 100% better, primarily go home".  Patient also stated that he cannot tell the difference between Zyprexa and the Risperdal that we started him on yesterday.  Stated that he just feels a little bit sleepy.  In regards to withdrawal symptoms, patient denied EtOH and benzos symptoms and that he denied getting Ativan.  Stated that he was having tobacco cravings instead.  Stated that he just took whenever that was offered to him, did not ask for medicines.  Patient also denied ever abusing benzos or opioids, stated that he has not used cocaine in 3 years.  Stated that his current substance of choice are tobacco, alcohol, marijuana. Patient continued to endorse that he has never had SI/SA/AVH ever. During the interview he denied SI/HI/AVH, first rank symptoms, paranoia.  Also denied headaches, anxiety,  diaphoresis, nausea, tremors, involuntary movements during the interview. However right after interview, RN notified this Thereasa Parkinauthor that the patient walked up to the window and asked for antinausea medicine.   Principal Problem: Bipolar II disorder (HCC) Diagnosis: Principal Problem:   Bipolar II disorder (HCC) Active Problems:   PTSD (post-traumatic stress disorder)   Alcohol abuse   Marijuana abuse   Autism spectrum disorder  Total Time spent with patient: 20 minutes  Past Psychiatric History:  Previous psych diagnosis: Bipolar 2, PTSD, Asperger syndrome Current psych meds: Olanzapine, patient has not been compliant however Past suicide attempts: None Past homicidal behavior: None Past nonsuicidal self-harm: None Psych hospitalizations: None, this is her first Psych outpatient: Patient [facility] Past psych med trials: Lithium, Depakote, Tegretol, Zoloft, olanzapine  Past Medical History:  Past Medical History:  Diagnosis Date   Bipolar 1 disorder (HCC)     Past Surgical History:  Procedure Laterality Date   ORIF RADIAL FRACTURE Left 01/24/2017   Procedure: OPEN REDUCTION INTERNAL FIXATION (ORIF) RADIAL FRACTURE;  Surgeon: Dominica SeverinGramig, William, MD;  Location: [facility] ORS;  Service: Orthopedics;  Laterality: Left;   TYMPANOSTOMY TUBE PLACEMENT Bilateral 1995   Family History: History reviewed. No pertinent family history. Family Psychiatric  History:  Mother reported that deceased father had similar presenting symptoms to patient in regards to mania and aggressive verbal outbursts  Social History:  Social History   Substance and Sexual Activity  Alcohol Use Yes   Alcohol/week: 24.0 standard drinks   Types: 24 Cans of beer per week  Comment: case a day     Social History   Substance and Sexual Activity  Drug Use Yes   Types: Marijuana, Cocaine    Social History   Socioeconomic History   Marital status: Single    Spouse name: Not on  file   Number of children: Not on file   Years of education: Not on file   Highest education level: Not on file  Occupational History   Not on file  Tobacco Use   Smoking status: Every Day    Packs/day: 2.00    Types: Cigarettes   Smokeless tobacco: Never  Vaping Use   Vaping Use: Never used  Substance and Sexual Activity   Alcohol use: Yes    Alcohol/week: 24.0 standard drinks    Types: 24 Cans of beer per week    Comment: case a day   Drug use: Yes    Types: Marijuana, Cocaine   Sexual activity: Not Currently  Other Topics Concern   Not on file  Social History Narrative   Not on file   Social Determinants of Health   Financial Resource Strain: Not on file  Food Insecurity: Not on file  Transportation Needs: Not on file  Physical Activity: Not on file  Stress: Not on file  Social Connections: Not on file   Additional Social History:  Patient currently lives in an apartment with 3 other roommates.  However patient stated that his roommates are kicking him out.  Patient is currently unemployed, but was left of a large sum of money from his deceased father.  Sleep: Fair  Appetite:  Good  Current Medications: Current Facility-Administered Medications  Medication Dose Route Frequency Provider Last Rate Last Admin   cloNIDine (CATAPRES) tablet 0.1 mg  0.1 mg Oral Once Princess Bruins, DO       hydrOXYzine (ATARAX/VISTARIL) tablet 25 mg  25 mg Oral Q6H PRN Princess Bruins, DO   25 mg at 03/29/21 2130   loperamide (IMODIUM) capsule 2-4 mg  2-4 mg Oral PRN Princess Bruins, DO       LORazepam (ATIVAN) tablet 1 mg  1 mg Oral Q6H PRN Princess Bruins, DO   1 mg at 03/29/21 1235   multivitamin with minerals tablet 1 tablet  1 tablet Oral Daily Princess Bruins, DO   1 tablet at 03/30/21 0834   nicotine (NICODERM CQ - dosed in mg/24 hours) patch 21 mg  21 mg Transdermal Daily Mason Jim, Amy E, MD   21 mg at 03/29/21 0821   ondansetron (ZOFRAN-ODT) disintegrating tablet 4 mg  4 mg Oral Q6H  PRN Princess Bruins, DO   4 mg at 03/30/21 7829   risperiDONE (RISPERDAL) tablet 1 mg  1 mg Oral Daily Princess Bruins, DO   1 mg at 03/30/21 5621   And   risperiDONE (RISPERDAL) tablet 2 mg  2 mg Oral QHS Princess Bruins, DO   2 mg at 03/29/21 2131   thiamine tablet 100 mg  100 mg Oral Daily Princess Bruins, DO   100 mg at 03/30/21 3086    Lab Results:  Results for orders placed or performed during the hospital encounter of 03/27/21 (from the past 48 hour(s))  Resp Panel by RT-PCR (Flu A&B, Covid) Nasopharyngeal Swab     Status: None   Collection Time: 03/29/21 12:46 PM   Specimen: Nasopharyngeal Swab; Nasopharyngeal(NP) swabs in vial transport medium  Result Value Ref Range   SARS Coronavirus 2 by RT PCR NEGATIVE NEGATIVE    Comment: (NOTE) SARS-CoV-2  target nucleic acids are NOT DETECTED.  The SARS-CoV-2 RNA is generally detectable in upper respiratory specimens during the acute phase of infection. The lowest concentration of SARS-CoV-2 viral copies this assay can detect is 138 copies/mL. A negative result does not preclude SARS-Cov-2 infection and should not be used as the sole basis for treatment or other patient management decisions. A negative result may occur with  improper specimen collection/handling, submission of specimen other than nasopharyngeal swab, presence of viral mutation(s) within the areas targeted by this assay, and inadequate number of viral copies(<138 copies/mL). A negative result must be combined with clinical observations, patient history, and epidemiological information. The expected result is Negative.  Fact Sheet for Patients:  BloggerCourse.com  Fact Sheet for Healthcare Providers:  SeriousBroker.it  This test is no t yet approved or cleared by the [place] FDA and  has been authorized for detection and/or diagnosis of SARS-CoV-2 by FDA under an Emergency Use Authorization (EUA). This EUA will remain   in effect (meaning this test can be used) for the duration of the COVID-19 declaration under Section 564(b)(1) of the Act, 21 U.S.C.section 360bbb-3(b)(1), unless the authorization is terminated  or revoked sooner.       Influenza A by PCR NEGATIVE NEGATIVE   Influenza B by PCR NEGATIVE NEGATIVE    Comment: (NOTE) The Xpert Xpress SARS-CoV-2/FLU/RSV plus assay is intended as an aid in the diagnosis of influenza from Nasopharyngeal swab specimens and should not be used as a sole basis for treatment. Nasal washings and aspirates are unacceptable for Xpert Xpress SARS-CoV-2/FLU/RSV testing.  Fact Sheet for Patients: BloggerCourse.com  Fact Sheet for Healthcare Providers: SeriousBroker.it  This test is not yet approved or cleared by the [place] FDA and has been authorized for detection and/or diagnosis of SARS-CoV-2 by FDA under an Emergency Use Authorization (EUA). This EUA will remain in effect (meaning this test can be used) for the duration of the COVID-19 declaration under Section 564(b)(1) of the Act, 21 U.S.C. section 360bbb-3(b)(1), unless the authorization is terminated or revoked.  Performed at [facility], [address], [place], [place] [address]     Blood Alcohol level:  Lab Results  Component Value Date   ETH <10 03/26/2021   ETH <10 03/03/2021    Metabolic Disorder Labs: Lab Results  Component Value Date   HGBA1C 5.5 03/03/2021   MPG 111.15 03/03/2021   No results found for: PROLACTIN Lab Results  Component Value Date   CHOL 142 03/03/2021   TRIG 84 03/03/2021   HDL 44 03/03/2021   CHOLHDL 3.2 03/03/2021   VLDL 17 03/03/2021   LDLCALC 81 03/03/2021    Physical Findings: AIMS: Facial and Oral Movements Muscles of Facial Expression: None, normal Lips and Perioral Area: None, normal Jaw: None, normal Tongue: None, normal,Extremity Movements Upper (arms, wrists, hands,  fingers): None, normal Lower (legs, knees, ankles, toes): None, normal, Trunk Movements Neck, shoulders, hips: None, normal, Overall Severity Severity of abnormal movements (highest score from questions above): None, normal Incapacitation due to abnormal movements: None, normal Patient's awareness of abnormal movements (rate only patient's report): No Awareness, Dental Status Current problems with teeth and/or dentures?: No Does patient usually wear dentures?: No  CIWA:  CIWA-Ar Total: 2  Musculoskeletal: Strength & Muscle Tone: within normal limits Gait & Station: normal, steady Patient leans: N/A  Psychiatric Specialty Exam:  Presentation  General Appearance: Appropriate for Environment; Casual; Fairly Groomed (Was seen sitting on his bed, shirtless with a blanket wrapped  around him like a cape.)    Eye Contact:Fair  Speech:Clear and Coherent; Normal Rate  Speech Volume:Normal  Handedness:Right   Mood and Affect  Mood:Irritable; Dysphoric  Affect:Congruent   Thought Process  Thought Processes:Goal Directed  Descriptions of Associations:Circumstantial  Orientation:Full (Time, Place and Person)  Thought Content:Delusions (Patient will present different histories with different staff members.) See subjective for more detail. Patient denied SI/HI/AVH, delusions, paranoia, first rank symptoms. Patient is not grossly responding to internal/external stimuli on exam.  History of Schizophrenia/Schizoaffective disorder:No  Duration of Psychotic Symptoms:N/A  Hallucinations:Hallucinations: None  Ideas of Reference:None  Suicidal Thoughts:Suicidal Thoughts: No  Homicidal Thoughts:Homicidal Thoughts: No   Sensorium  Memory:Immediate Good; Recent Fair; Remote Fair  Judgment:Impaired  Insight:Poor   Executive Functions  Concentration:Good  Attention Span:Fair  Recall:Fair  Fund of Knowledge:Fair  Language:Good   Psychomotor Activity  Psychomotor  Activity:Psychomotor Activity: Normal No muscle stiffness, restlessness, tremors, involuntary movements on physical exam.   Assets  Assets:Desire for Improvement; Housing; Physical Health   Sleep  Sleep:Sleep: Fair Number of Hours of Sleep: 6.25    Physical Exam: Physical Exam Vitals and nursing note reviewed.  HENT:     Head: Normocephalic.  Pulmonary:     Effort: Pulmonary effort is normal.  Neurological:     Mental Status: He is alert and oriented to person, place, and time.   Review of Systems  Respiratory:  Negative for shortness of breath.   Cardiovascular:  Negative for chest pain.  Gastrointestinal:  Negative for abdominal pain, constipation, diarrhea, nausea and vomiting.  Neurological:  Negative for dizziness and headaches.  Blood pressure (!) 134/103, pulse (!) 102, temperature 98.1 F (36.7 C), temperature source Oral, resp. rate 18, height 5' 9.5" (1.765 m), weight 71.7 kg, SpO2 100 %. Body mass index is 23 kg/m.   Treatment Plan Summary: Daily contact with patient to assess and evaluate symptoms and progress in treatment and Medication management  Shawn Ford is a 30 y.o. male, with reported past psychiatric history of bipolar 2 disorder, PTSD, alcohol abuse, marijuana abuse and autism spectrum disorder. Patient presents voluntarily to [facility] (03/27/2021), transferred from [facility] ED after medical clearance for SI. [facility] stay day 3.     ASSESSMENT:  Diagnosis:  - Bipolar disorder - PTSD - Polysubstance abuse - Autism spectrum disorder  TODAY (03/30/2021): Patient was seen sitting on his bed shirtless with blanket draped like a tape. Patient continues to adamantly deny SI/AVH.  Since patient has been giving inconsistent histories to different staff members, will keep close her eyes on possible EtOH/benzos withdrawal symptoms.  We will also keep an eye on his BP.   PSYCHIATRIC DIAGNOSIS & TREATMENT:  Bipolar  disorder Continue Risperdal 1 mg p.o. daily and Risperdal 2 mg p.o. nightly             CBC within normal limits (8/6)             CMP: glc 105, otherwise within normal limits (8/6)        Lipid panel within normal limits (7/14)             Hb A1c 5.5 (7/14)  EKG: NSR, QTc 426 (8/9)     Polysubstance abuse (alcohol, marijuana, tobacco) Nicotine 21 mg patches Thiamine 100 milligrams p.o. daily Multivitamin daily Discussed risk and encouraged cessation.             BAL <10 (8/6)  UDS positive for THC (8/6) Continue CIWA protocol   MEDICAL ISSUES BEING ADDRESSED:  Hypertension Clonidine 0.1 mg p.o. 1 dose today (8/10)   PRN's -Trazadone 50mg  PO qHS PRN for insomnia -Hydroxyzine 25mg  PO TID PRN for anxiety -Alum & mag hydroxide-simeth 31ml PO qHS PRN for GERD -Magnesium hydroxide 3ml PO daily PRN for constipation -Acetaminophen tablet 650mg  PO PRN q6hrs for mild pain  -Imodium 2 to 4 mg PO PRN for diarrhea or loose stools -Zofran 4 mg PO q6hrs PRN  Discharge Planning: -Social work and case management to assist with discharge planning and identification of hospital follow-up needs prior to discharge -Estimated LOS: 3-4 days -Discharge Concerns: Need to establish a safety plan; Medication compliance and effectiveness -Discharge Goals: Return home with outpatient referrals for mental health follow-up including medication management/psychotherapy  Safety and Monitoring: -Voluntary admission to inpatient psychiatric unit for safety, stabilization and treatment -Daily contact with patient to assess and evaluate symptoms and progress in treatment -Patient's case to be discussed in multi-disciplinary team meeting -Observation Level : q15 minute checks -Vital signs: q12 hours -Precautions: suicide, elopement, and assault   31m, DO, PGY-1 03/30/2021, 11:32 AM

## 2021-03-30 NOTE — Progress Notes (Signed)
Patient did not attend Self-Esteem group because he was put of Droplet/Contact Precaution and asked to stay in his room.

## 2021-03-30 NOTE — Progress Notes (Signed)
POC Covid rapid test result:  NEGATIVE

## 2021-03-30 NOTE — Progress Notes (Signed)
Pt has been A&O to person, place, time but not to situation. Pt is noted to be hyperactive, easily irritable, complains of nausea and vomiting, a runny nose and pt has been hypertensive, this was reported to pt's MD Princess Bruins, MD this morning. Pt CIWA=4. Pt reports some paranoid thought content, reporting that the hospital staff has been "making up stories about people having covid in order to keep all the patients in the hospital longer." Pt was noted to be talking to himself, responding to internal stimuli but denies AVH, denies SI/HI, denies feelings of depression, reports anxiety. PT was given PRN medication for his c/o nausea with good effect. Pt's appetite is good. Pt talked on the telephone at times as well. Pt is medication complaint. Will continue to monitor pt per Q15 minute face checks and monitor for safety and progress.

## 2021-03-30 NOTE — [facility] Group Notes (Signed)
Due to COVID precautions on the unit, pts were given packet to complete individually.   Shawn Ford, LCSWA 

## 2021-03-31 LAB — RESP PANEL BY RT-PCR (FLU A&B, COVID) ARPGX2
Influenza A by PCR: NEGATIVE
Influenza B by PCR: NEGATIVE
SARS Coronavirus 2 by RT PCR: NEGATIVE

## 2021-03-31 LAB — COMPREHENSIVE METABOLIC PANEL
ALT: 15 U/L (ref 0–44)
AST: 16 U/L (ref 15–41)
Albumin: 4.7 g/dL (ref 3.5–5.0)
Alkaline Phosphatase: 72 U/L (ref 38–126)
Anion gap: 9 (ref 5–15)
BUN: 18 mg/dL (ref 6–20)
CO2: 25 mmol/L (ref 22–32)
Calcium: 9.1 mg/dL (ref 8.9–10.3)
Chloride: 103 mmol/L (ref 98–111)
Creatinine, Ser: 1.06 mg/dL (ref 0.61–1.24)
GFR, Estimated: >60 mL/min (ref >60)
Glucose, Bld: 122 mg/dL — ABNORMAL HIGH (ref 70–99)
Potassium: 4 mmol/L (ref 3.5–5.1)
Sodium: 137 mmol/L (ref 135–145)
Total Bilirubin: 0.6 mg/dL (ref 0.3–1.2)
Total Protein: 7.4 g/dL (ref 6.5–8.1)

## 2021-03-31 MED ORDER — MELATONIN 3 MG PO TABS
3.0000 mg | ORAL_TABLET | Freq: Every day | ORAL | Status: DC
  Administered 2021-03-31 – 2021-04-01 (×2): 3 mg via ORAL
  Filled 2021-03-31 (×4): qty 1

## 2021-03-31 MED ORDER — AMLODIPINE BESYLATE 5 MG PO TABS
5.0000 mg | ORAL_TABLET | Freq: Every day | ORAL | Status: DC
  Administered 2021-03-31 – 2021-04-02 (×3): 5 mg via ORAL
  Filled 2021-03-31 (×6): qty 1

## 2021-03-31 MED ORDER — RISPERIDONE 2 MG PO TABS
2.0000 mg | ORAL_TABLET | Freq: Every day | ORAL | Status: DC
  Administered 2021-04-01 – 2021-04-02 (×2): 2 mg via ORAL
  Filled 2021-03-31 (×5): qty 1

## 2021-03-31 MED ORDER — RISPERIDONE 3 MG PO TABS
3.0000 mg | ORAL_TABLET | Freq: Every day | ORAL | Status: DC
  Administered 2021-03-31 – 2021-04-01 (×2): 3 mg via ORAL
  Filled 2021-03-31 (×4): qty 1

## 2021-03-31 NOTE — Tx Team (Signed)
Interdisciplinary Treatment and Diagnostic Plan Update  03/31/2021 Time of Session: 9:50am Shawn Ford MRN: [id number]  Principal Diagnosis: Bipolar II disorder (HCC)  Secondary Diagnoses: Principal Problem:   Bipolar II disorder (HCC) Active Problems:   PTSD (post-traumatic stress disorder)   Alcohol abuse   Marijuana abuse   Autism spectrum disorder   Current Medications:  Current Facility-Administered Medications  Medication Dose Route Frequency Provider Last Rate Last Admin   amLODipine (NORVASC) tablet 5 mg  5 mg Oral Daily Antonieta Pert, MD   5 mg at 03/31/21 0908   hydrOXYzine (ATARAX/VISTARIL) tablet 25 mg  25 mg Oral Q6H PRN Princess Bruins, DO   25 mg at 03/30/21 2048   loperamide (IMODIUM) capsule 2-4 mg  2-4 mg Oral PRN Princess Bruins, DO       LORazepam (ATIVAN) tablet 1 mg  1 mg Oral Q6H PRN Princess Bruins, DO   1 mg at 03/29/21 1235   melatonin tablet 3 mg  3 mg Oral QHS Princess Bruins, DO       multivitamin with minerals tablet 1 tablet  1 tablet Oral Daily Princess Bruins, DO   1 tablet at 03/31/21 0906   nicotine (NICODERM CQ - dosed in mg/24 hours) patch 21 mg  21 mg Transdermal Daily Mason Jim, Amy E, MD   21 mg at 03/31/21 0908   ondansetron (ZOFRAN-ODT) disintegrating tablet 4 mg  4 mg Oral Q6H PRN Princess Bruins, DO   4 mg at 03/30/21 0835   [START ON 04/01/2021] risperiDONE (RISPERDAL) tablet 2 mg  2 mg Oral Daily Princess Bruins, DO       And   risperiDONE (RISPERDAL) tablet 3 mg  3 mg Oral QHS Princess Bruins, DO       thiamine tablet 100 mg  100 mg Oral Daily Princess Bruins, DO   100 mg at 03/31/21 0906   PTA Medications: Medications Prior to Admission  Medication Sig Dispense Refill Last Dose   hydrOXYzine (ATARAX/VISTARIL) 25 MG tablet Take 1 tablet (25 mg total) by mouth 3 (three) times daily. 90 tablet 0    OLANZapine (ZYPREXA) 10 MG tablet Take 1 tablet (10 mg total) by mouth at bedtime. 30 tablet 0     Patient Stressors: Health problems Loss of  father Marital or family conflict Substance abuse  Patient Strengths: Ability for insight Average or above average intelligence General fund of knowledge Motivation for treatment/growth Physical Health Religious Affiliation Supportive family/friends Work skills  Treatment Modalities: Medication Management, Group therapy, Case management,  1 to 1 session with clinician, Psychoeducation, Recreational therapy.   Physician Treatment Plan for Primary Diagnosis: Bipolar II disorder (HCC) Long Term Goal(s): Improvement in symptoms so as ready for discharge   Short Term Goals: Ability to identify changes in lifestyle to reduce recurrence of condition will improve Ability to verbalize feelings will improve Ability to disclose and discuss suicidal ideas Ability to demonstrate self-control will improve Ability to identify and develop effective coping behaviors will improve Ability to maintain clinical measurements within normal limits will improve Compliance with prescribed medications will improve Ability to identify triggers associated with substance abuse/mental health issues will improve  Medication Management: Evaluate patient's response, side effects, and tolerance of medication regimen.  Therapeutic Interventions: 1 to 1 sessions, Unit Group sessions and Medication administration.  Evaluation of Outcomes: Progressing  Physician Treatment Plan for Secondary Diagnosis: Principal Problem:   Bipolar II disorder (HCC) Active Problems:   PTSD (post-traumatic stress disorder)   Alcohol abuse  Marijuana abuse   Autism spectrum disorder  Long Term Goal(s): Improvement in symptoms so as ready for discharge   Short Term Goals: Ability to identify changes in lifestyle to reduce recurrence of condition will improve Ability to verbalize feelings will improve Ability to disclose and discuss suicidal ideas Ability to demonstrate self-control will improve Ability to identify and develop  effective coping behaviors will improve Ability to maintain clinical measurements within normal limits will improve Compliance with prescribed medications will improve Ability to identify triggers associated with substance abuse/mental health issues will improve     Medication Management: Evaluate patient's response, side effects, and tolerance of medication regimen.  Therapeutic Interventions: 1 to 1 sessions, Unit Group sessions and Medication administration.  Evaluation of Outcomes: Progressing   RN Treatment Plan for Primary Diagnosis: Bipolar II disorder (HCC) Long Term Goal(s): Knowledge of disease and therapeutic regimen to maintain health will improve  Short Term Goals: Ability to remain free from injury will improve, Ability to verbalize frustration and anger appropriately will improve, Ability to participate in decision making will improve, Ability to verbalize feelings will improve, Ability to identify and develop effective coping behaviors will improve, and Compliance with prescribed medications will improve  Medication Management: RN will administer medications as ordered by provider, will assess and evaluate patient's response and provide education to patient for prescribed medication. RN will report any adverse and/or side effects to prescribing provider.  Therapeutic Interventions: 1 on 1 counseling sessions, Psychoeducation, Medication administration, Evaluate responses to treatment, Monitor vital signs and CBGs as ordered, Perform/monitor CIWA, COWS, AIMS and Fall Risk screenings as ordered, Perform wound care treatments as ordered.  Evaluation of Outcomes: Progressing   LCSW Treatment Plan for Primary Diagnosis: Bipolar II disorder (HCC) Long Term Goal(s): Safe transition to appropriate next level of care at discharge, Engage patient in therapeutic group addressing interpersonal concerns.  Short Term Goals: Engage patient in aftercare planning with referrals and  resources, Increase social support, Increase ability to appropriately verbalize feelings, Increase emotional regulation, Identify triggers associated with mental health/substance abuse issues, and Increase skills for wellness and recovery  Therapeutic Interventions: Assess for all discharge needs, 1 to 1 time with Social worker, Explore available resources and support systems, Assess for adequacy in community support network, Educate family and significant other(s) on suicide prevention, Complete Psychosocial Assessment, Interpersonal group therapy.  Evaluation of Outcomes: Progressing   Progress in Treatment: Attending groups: Yes. Participating in groups: Yes. Taking medication as prescribed: Yes. Toleration medication: Yes. Family/Significant other contact made: Yes, individual(s) contacted:  mother Patient understands diagnosis: Yes. Discussing patient identified problems/goals with staff: Yes. Medical problems stabilized or resolved: Yes. Denies suicidal/homicidal ideation: Yes. Issues/concerns per patient self-inventory: No.   New problem(s) identified: No, Describe:  none  New Short Term/Long Term Goal(s): detox, medication management for mood stabilization; elimination of SI thoughts; development of comprehensive mental wellness/sobriety plan   Patient Goals: Did not attend   Discharge Plan or Barriers: Pt is currently waiting to receive his LAI. Pt currently has nowhere to go at discharge but has been given shelter and [employer].     Reason for Continuation of Hospitalization: Anxiety Depression Medication stabilization Suicidal ideation Withdrawal symptoms  Estimated Length of Stay: 3-5 days  Attendees: Patient: Did not attend 03/31/2021   Physician: Arna Snipe, DO 03/31/2021   Nursing:  03/31/2021   RN Care Manager: 03/31/2021   Social Worker: Ruthann Cancer, LCSW 03/31/2021   Recreational Therapist:  03/31/2021   Other:  03/31/2021  Other:   03/31/2021   Other: 03/31/2021     Scribe for Treatment Team: Felizardo Hoffmann, LCSWA 03/31/2021 2:10 PM

## 2021-03-31 NOTE — Progress Notes (Addendum)
[facility] MD Progress Note  03/31/2021 11:52 AM JULIEN OSCAR  MRN:  [id number]  CC: SI and delusions  Subjective:  Shawn Ford is a 30 y.o. male, with reported past psychiatric history of bipolar 2 disorder, PTSD, alcohol abuse, marijuana abuse and autism spectrum disorder. Patient presents voluntarily to [facility] (03/27/2021), transferred from [facility] ED after medical clearance for SI. [facility] stay day 4.   24hr events:  The patient's chart was reviewed and nursing notes were reviewed. The patient's case was discussed in multidisciplinary team meeting.  Per MAR: - Patient is compliant with scheduled meds. - PRNs: Vistaril for sleep and Zofran for nausea Per RN notes, patient has been seen talking to himself, paranoid about staff fibbing about different patient with COVID.  Patient is COVID-negative (8/9). Currently on room restriction for quarantine. Patient slept Number of Hours: 6.75.   Today (03/31/2021): The patient was seen and evaluated on the unit.  On assessment today the patient reported reported again that he is bored and that he wants to go home.  Patient said that he is feeling well on Risperdal, that he has taken it before with no issues.  When asked about [facility] and SI, patient stated again denied going to [facility] and stated that " I lied to my mom" about SI.  Patient continued to endorse having tobacco withdrawal, denied symptoms of diaphoresis, nausea, tremors, headaches, vomiting.  Stated that him being quarantined in his room is giving him anxiety, and that he was believed to join group.  Discussed with patient that if his next COVID test is negative, continue restriction may be lifted.  Also discussed with patient LAI, patient is amenable. Patient reported that his mood and appetite are "good".  Stated that he feels like he is sleeping too much, however denies nightmares.  Did report that he has trouble falling asleep, that he is not a morning person.   Stated that he feels restful. Patient continues to deny SI/HI/AVH, paranoia, delusions, first rank symptoms.   Principal Problem: Bipolar II disorder (HCC) Diagnosis: Principal Problem:   Bipolar II disorder (HCC) Active Problems:   PTSD (post-traumatic stress disorder)   Alcohol abuse   Marijuana abuse   Autism spectrum disorder  Total Time spent with patient: 20 minutes  Past Psychiatric History:  Previous psych diagnosis: Bipolar 2, PTSD, Asperger syndrome Current psych meds: Olanzapine, patient has not been compliant however Past suicide attempts: None Past homicidal behavior: None Past nonsuicidal self-harm: None Psych hospitalizations: None, this is her first Psych outpatient: Patient sees Dr. Jennelle Human at [facility] Past psych med trials: Lithium, Depakote, Tegretol, Zoloft, olanzapine  Past Medical History:  Past Medical History:  Diagnosis Date   Bipolar 1 disorder (HCC)     Past Surgical History:  Procedure Laterality Date   ORIF RADIAL FRACTURE Left 01/24/2017   Procedure: OPEN REDUCTION INTERNAL FIXATION (ORIF) RADIAL FRACTURE;  Surgeon: Dominica Severin, MD;  Location: [facility] ORS;  Service: Orthopedics;  Laterality: Left;   TYMPANOSTOMY TUBE PLACEMENT Bilateral 1995   Family History: History reviewed. No pertinent family history. Family Psychiatric  History:  Mother reported that deceased father had similar presenting symptoms to patient in regards to mania and aggressive verbal outbursts  Social History:  Social History   Substance and Sexual Activity  Alcohol Use Yes   Alcohol/week: 24.0 standard drinks   Types: 24 Cans of beer per week   Comment: case a day     Social History  Substance and Sexual Activity  Drug Use Yes   Types: Marijuana, Cocaine    Social History   Socioeconomic History   Marital status: Single    Spouse name: Not on file   Number of children: Not on file   Years of education: Not on file   Highest education  level: Not on file  Occupational History   Not on file  Tobacco Use   Smoking status: Every Day    Packs/day: 2.00    Types: Cigarettes   Smokeless tobacco: Never  Vaping Use   Vaping Use: Never used  Substance and Sexual Activity   Alcohol use: Yes    Alcohol/week: 24.0 standard drinks    Types: 24 Cans of beer per week    Comment: case a day   Drug use: Yes    Types: Marijuana, Cocaine   Sexual activity: Not Currently  Other Topics Concern   Not on file  Social History Narrative   Not on file   Social Determinants of Health   Financial Resource Strain: Not on file  Food Insecurity: Not on file  Transportation Needs: Not on file  Physical Activity: Not on file  Stress: Not on file  Social Connections: Not on file   Additional Social History:  Patient currently lives in an apartment with 3 other roommates.  However patient stated that his roommates are kicking him out.  Patient is currently unemployed, but was left of a large sum of money from his deceased father.  Sleep: Fair  Appetite:  Good  Current Medications: Current Facility-Administered Medications  Medication Dose Route Frequency Provider Last Rate Last Admin   amLODipine (NORVASC) tablet 5 mg  5 mg Oral Daily Antonieta Pert, MD   5 mg at 03/31/21 0908   hydrOXYzine (ATARAX/VISTARIL) tablet 25 mg  25 mg Oral Q6H PRN Princess Bruins, DO   25 mg at 03/30/21 2048   loperamide (IMODIUM) capsule 2-4 mg  2-4 mg Oral PRN Princess Bruins, DO       LORazepam (ATIVAN) tablet 1 mg  1 mg Oral Q6H PRN Princess Bruins, DO   1 mg at 03/29/21 1235   melatonin tablet 3 mg  3 mg Oral QHS Princess Bruins, DO       multivitamin with minerals tablet 1 tablet  1 tablet Oral Daily Princess Bruins, DO   1 tablet at 03/31/21 0906   nicotine (NICODERM CQ - dosed in mg/24 hours) patch 21 mg  21 mg Transdermal Daily Mason Jim, Amy E, MD   21 mg at 03/31/21 0908   ondansetron (ZOFRAN-ODT) disintegrating tablet 4 mg  4 mg Oral Q6H PRN Princess Bruins, DO   4 mg at 03/30/21 0835   [START ON 04/01/2021] risperiDONE (RISPERDAL) tablet 2 mg  2 mg Oral Daily Princess Bruins, DO       And   risperiDONE (RISPERDAL) tablet 3 mg  3 mg Oral QHS Princess Bruins, DO       thiamine tablet 100 mg  100 mg Oral Daily Princess Bruins, DO   100 mg at 03/31/21 2993    Lab Results:  Results for orders placed or performed during the hospital encounter of 03/27/21 (from the past 48 hour(s))  Resp Panel by RT-PCR (Flu A&B, Covid) Nasopharyngeal Swab     Status: None   Collection Time: 03/29/21 12:46 PM   Specimen: Nasopharyngeal Swab; Nasopharyngeal(NP) swabs in vial transport medium  Result Value Ref Range   SARS Coronavirus 2 by RT PCR NEGATIVE  NEGATIVE    Comment: (NOTE) SARS-CoV-2 target nucleic acids are NOT DETECTED.  The SARS-CoV-2 RNA is generally detectable in upper respiratory specimens during the acute phase of infection. The lowest concentration of SARS-CoV-2 viral copies this assay can detect is 138 copies/mL. A negative result does not preclude SARS-Cov-2 infection and should not be used as the sole basis for treatment or other patient management decisions. A negative result may occur with  improper specimen collection/handling, submission of specimen other than nasopharyngeal swab, presence of viral mutation(s) within the areas targeted by this assay, and inadequate number of viral copies(<138 copies/mL). A negative result must be combined with clinical observations, patient history, and epidemiological information. The expected result is Negative.  Fact Sheet for Patients:  BloggerCourse.com  Fact Sheet for Healthcare Providers:  SeriousBroker.it  This test is no t yet approved or cleared by the [place] FDA and  has been authorized for detection and/or diagnosis of SARS-CoV-2 by FDA under an Emergency Use Authorization (EUA). This EUA will remain  in effect (meaning this test  can be used) for the duration of the COVID-19 declaration under Section 564(b)(1) of the Act, 21 U.S.C.section 360bbb-3(b)(1), unless the authorization is terminated  or revoked sooner.       Influenza A by PCR NEGATIVE NEGATIVE   Influenza B by PCR NEGATIVE NEGATIVE    Comment: (NOTE) The Xpert Xpress SARS-CoV-2/FLU/RSV plus assay is intended as an aid in the diagnosis of influenza from Nasopharyngeal swab specimens and should not be used as a sole basis for treatment. Nasal washings and aspirates are unacceptable for Xpert Xpress SARS-CoV-2/FLU/RSV testing.  Fact Sheet for Patients: BloggerCourse.com  Fact Sheet for Healthcare Providers: SeriousBroker.it  This test is not yet approved or cleared by the [place] FDA and has been authorized for detection and/or diagnosis of SARS-CoV-2 by FDA under an Emergency Use Authorization (EUA). This EUA will remain in effect (meaning this test can be used) for the duration of the COVID-19 declaration under Section 564(b)(1) of the Act, 21 U.S.C. section 360bbb-3(b)(1), unless the authorization is terminated or revoked.  Performed at [facility], [address], [place], [place] [address]     Blood Alcohol level:  Lab Results  Component Value Date   ETH <10 03/26/2021   ETH <10 03/03/2021    Metabolic Disorder Labs: Lab Results  Component Value Date   HGBA1C 5.5 03/03/2021   MPG 111.15 03/03/2021   No results found for: PROLACTIN Lab Results  Component Value Date   CHOL 142 03/03/2021   TRIG 84 03/03/2021   HDL 44 03/03/2021   CHOLHDL 3.2 03/03/2021   VLDL 17 03/03/2021   LDLCALC 81 03/03/2021    Physical Findings: AIMS: Facial and Oral Movements Muscles of Facial Expression: None, normal Lips and Perioral Area: None, normal Jaw: None, normal Tongue: None, normal,Extremity Movements Upper (arms, wrists, hands, fingers): None, normal Lower  (legs, knees, ankles, toes): None, normal, Trunk Movements Neck, shoulders, hips: None, normal, Overall Severity Severity of abnormal movements (highest score from questions above): None, normal Incapacitation due to abnormal movements: None, normal Patient's awareness of abnormal movements (rate only patient's report): No Awareness, Dental Status Current problems with teeth and/or dentures?: No Does patient usually wear dentures?: No  CIWA:  CIWA-Ar Total: 1  Musculoskeletal: Strength & Muscle Tone: within normal limits Gait & Station: normal, steady Patient leans: N/A  Psychiatric Specialty Exam:  Presentation  General Appearance: Appropriate for Environment; Casual; Fairly Groomed  Eye Contact:Fair  Speech:Clear and Coherent; Normal Rate  Speech Volume:Normal  Handedness:Right   Mood and Affect  Mood:Dysphoric; Labile  Affect:Congruent   Thought Process  Thought Processes:Coherent; Goal Directed  Descriptions of Associations:Circumstantial  Orientation:Full (Time, Place and Person)  Thought Content:Delusions  Patient still denies ever going to [facility] for SI. Patient denied SI/HI/AVH, delusions, paranoia, first rank symptoms. Patient is not grossly responding to internal/external stimuli on exam.  History of Schizophrenia/Schizoaffective disorder:No  Duration of Psychotic Symptoms:N/A  Hallucinations:Hallucinations: None  Ideas of Reference:None  Suicidal Thoughts:Suicidal Thoughts: No  Homicidal Thoughts:Homicidal Thoughts: No   Sensorium  Memory:Immediate Good; Recent Fair; Remote Fair  Judgment:Impaired  Insight:Poor   Executive Functions  Concentration:Good  Attention Span:Fair  Recall:Fair  Fund of Knowledge:Fair  Language:Good   Psychomotor Activity  Psychomotor Activity:Psychomotor Activity: Normal No muscle stiffness, restlessness, tremors, involuntary movements on physical exam.   Assets  Assets:Desire for Improvement;  Housing; Social Support; Physical Health   Sleep  Sleep:Sleep: Fair Number of Hours of Sleep: 6.75    Physical Exam: Physical Exam Vitals and nursing note reviewed.  HENT:     Head: Normocephalic.  Pulmonary:     Effort: Pulmonary effort is normal.  Neurological:     Mental Status: He is alert and oriented to person, place, and time.   Review of Systems  Respiratory:  Negative for shortness of breath.   Cardiovascular:  Negative for chest pain.  Gastrointestinal:  Negative for abdominal pain, constipation, diarrhea, nausea and vomiting.  Neurological:  Negative for dizziness and headaches.  Blood pressure (!) 151/101, pulse (!) 105, temperature 98 F (36.7 C), temperature source Oral, resp. rate 18, height 5' 9.5" (1.765 m), weight 71.7 kg, SpO2 100 %. Body mass index is 23 kg/m.   Treatment Plan Summary: Daily contact with patient to assess and evaluate symptoms and progress in treatment and Medication management  AVERI CACIOPPO is a 30 y.o. male, with reported past psychiatric history of bipolar 2 disorder, PTSD, alcohol abuse, marijuana abuse and autism spectrum disorder. Patient presents voluntarily to [facility] (03/27/2021), transferred from [facility] ED after medical clearance for SI. [facility] stay day 4.     ASSESSMENT:  Diagnosis:  - Bipolar disorder - PTSD - Polysubstance abuse - Autism spectrum disorder - Hypertension - COVID  TODAY (03/31/2021): Patient was seen sitting on his bed.  More pleasant and cooperative on interview today.  No change on insight about his previous [facility] visit. Will keep close her eyes on possible EtOH/benzos withdrawal symptoms.  We will also keep an eye on his BP.   PSYCHIATRIC DIAGNOSIS & TREATMENT:  Bipolar disorder Increase Risperdal 1 mg p.o. daily and Risperdal 2 mg p.o. nightly to Risperdal 2 mg p.o. daily and Risperdal 3 mg p.o. nightly             CBC within normal limits (8/6)             CMP: glc 105,  otherwise within normal limits (8/6)        Lipid panel within normal limits (7/14)             Hb A1c 5.5 (7/14)  EKG: NSR, QTc 426 (8/9) Start melatonin 3 mg p.o. nightly     Polysubstance abuse (alcohol, marijuana, tobacco) Nicotine 21 mg patches Thiamine 100 milligrams p.o. daily Multivitamin daily Discussed risk and encouraged cessation.             BAL <10 (8/6)  UDS positive for THC (8/6) Continue CIWA protocol CIWA 12 (8/9) CIWA less than or equal to 4 since 8/10   MEDICAL ISSUES BEING ADDRESSED:  Hypertension Clonidine 0.1 mg p.o. 1 dose today (8/10) Start amlodipine 5 mg p.o. daily Repeat CMP ordered  COVID COVID RT-PCR test ordered Continue airborne and contact precautions   PRN's -Trazadone 50mg  PO qHS PRN for insomnia -Hydroxyzine 25mg  PO TID PRN for anxiety -Alum & mag hydroxide-simeth 30ml PO qHS PRN for GERD -Magnesium hydroxide 30ml PO daily PRN for constipation -Acetaminophen tablet 650mg  PO PRN q6hrs for mild pain  -Imodium 2 to 4 mg PO PRN for diarrhea or loose stools -Zofran 4 mg PO q6hrs PRN  Discharge Planning: -Social work and case management to assist with discharge planning and identification of hospital follow-up needs prior to discharge -Estimated LOS: 3-4 days -Discharge Concerns: Need to establish a safety plan; Medication compliance and effectiveness -Discharge Goals: Return home with outpatient referrals for mental health follow-up including medication management/psychotherapy  Safety and Monitoring: -Voluntary admission to inpatient psychiatric unit for safety, stabilization and treatment -Daily contact with patient to assess and evaluate symptoms and progress in treatment -Patient's case to be discussed in multi-disciplinary team meeting -Observation Level : q15 minute checks -Vital signs: q12 hours -Precautions: suicide, elopement, and assault   Princess BruinsJulie Zollie Ellery, DO, PGY-1 03/31/2021, 11:52 AM

## 2021-03-31 NOTE — Progress Notes (Signed)
Pt visible in the dayroom some this evening, pt stated he was feeling better due to being able to come out the room.     03/31/21 2200  Psych Admission Type (Psych Patients Only)  Admission Status Voluntary  Psychosocial Assessment  Patient Complaints Anxiety  Eye Contact Brief  Facial Expression Anxious  Affect Anxious  Speech Logical/coherent  Interaction Assertive  Motor Activity Fidgety  Appearance/Hygiene In scrubs  Behavior Characteristics Cooperative  Mood Anxious  Thought Process  Coherency WDL  Content WDL  Delusions Paranoid  Perception WDL  Hallucination None reported or observed  Judgment Limited  Confusion None  Danger to Self  Current suicidal ideation? Denies  Danger to Others  Danger to Others None reported or observed

## 2021-03-31 NOTE — Progress Notes (Signed)
Pt irritable about having to stay in his room. Pt paranoid about various things. Pt given PRN Vistaril with HS medication    03/31/21 0200  Psych Admission Type (Psych Patients Only)  Admission Status Voluntary  Psychosocial Assessment  Patient Complaints Anxiety;Irritability  Eye Contact Brief  Facial Expression Anxious  Affect Anxious  Speech Logical/coherent  Interaction Assertive  Motor Activity Pacing  Appearance/Hygiene In scrubs  Behavior Characteristics Anxious;Irritable  Mood Irritable  Thought Process  Coherency WDL  Content WDL  Delusions Controlled  Perception WDL  Hallucination None reported or observed  Judgment Limited  Confusion None  Danger to Self  Current suicidal ideation? Denies  Danger to Others  Danger to Others None reported or observed

## 2021-03-31 NOTE — Progress Notes (Signed)
Pt was pleasant and cooperative this shift.  Pt was retested for covid today and pt was negative.  Pt denies SI/HI/AVH.  Pt is currently in dayroom interacting with his peers. Pt remains safe on the unit with q 15 min checks in place.  03/31/21 0900  Psych Admission Type (Psych Patients Only)  Admission Status Voluntary  Psychosocial Assessment  Patient Complaints Anxiety;Irritability  Eye Contact Brief  Facial Expression Anxious  Affect Anxious  Speech Logical/coherent  Interaction Assertive  Motor Activity Fidgety  Appearance/Hygiene In scrubs  Behavior Characteristics Cooperative;Anxious  Mood Anxious  Thought Process  Coherency WDL  Content WDL  Delusions Paranoid  Perception WDL  Hallucination None reported or observed  Judgment Limited  Confusion None  Danger to Self  Current suicidal ideation? Denies  Danger to Others  Danger to Others None reported or observed

## 2021-03-31 NOTE — Progress Notes (Signed)
Group was not held due to staffing.  

## 2021-04-01 MED ORDER — PALIPERIDONE PALMITATE ER 156 MG/ML IM SUSY: 156.0000 mg | PREFILLED_SYRINGE | Freq: Once | INTRAMUSCULAR | Status: DC

## 2021-04-01 MED ORDER — PALIPERIDONE PALMITATE ER 234 MG/1.5ML IM SUSY
234.0000 mg | PREFILLED_SYRINGE | Freq: Once | INTRAMUSCULAR | Status: AC
  Administered 2021-04-01: 234 mg via INTRAMUSCULAR

## 2021-04-01 MED ORDER — WHITE PETROLATUM EX OINT
TOPICAL_OINTMENT | CUTANEOUS | Status: AC
  Filled 2021-04-01: qty 5

## 2021-04-01 NOTE — Progress Notes (Signed)
Pt denies SI/HI/AVH, is A&Ox4, calm cooperative pleasant. Will continue to monitor pt per Q15 minute face checks and monitor for safety and progress.

## 2021-04-01 NOTE — Progress Notes (Signed)
     04/01/21 2110  Psych Admission Type (Psych Patients Only)  Admission Status Voluntary  Psychosocial Assessment  Patient Complaints Anxiety;Depression  Eye Contact Brief  Facial Expression Anxious  Affect Anxious  Speech Logical/coherent  Interaction Assertive  Motor Activity Fidgety  Appearance/Hygiene In scrubs  Behavior Characteristics Restless  Mood Pleasant;Anxious  Thought Process  Coherency WDL  Content WDL  Delusions Paranoid  Perception WDL  Hallucination None reported or observed  Judgment Limited  Confusion None  Danger to Self  Current suicidal ideation? Denies  Danger to Others  Danger to Others None reported or observed

## 2021-04-01 NOTE — Progress Notes (Signed)
   04/01/21 2126  Vital Signs  Pulse Rate 70  BP (!) 147/92  BP Location Right Arm  BP Method Automatic  Patient Position (if appropriate) Sitting    BP Elevated      04/01/21 2310  Vital Signs  Pulse Rate 83  BP 126/67  BP Location Right Arm  BP Method Automatic  Patient Position (if appropriate) Lying   BP Recheck = BP normal

## 2021-04-01 NOTE — BHH Counselor (Signed)
CSW spoke with Cristine Polio (mother) who states that she is concerned that her son will not call [facility] on his own unless someone sits with him during the day and "makes him call".  She states that "he has Autism and will not do things on his own".  CSW explained to Mrs. Manson Passey that there is not staff to sit with him all day but that the staff does encourage him to make phone calls and take steps forward in his own treatment and discharge.  CSW informed Mrs. Manson Passey that her son would likely be discharged from [facility] by Sunday and she states that she would rather her son be discharged on Sunday 04/03/2021 so she can take him to the shelter or [facility].  Mrs. Manson Passey states that she is also agreeable to her son have a long acting injectable for his medications.  Mrs. Manson Passey states that she will be calling and making sure that her son is calling the housing options and is participating in his own treatment.

## 2021-04-01 NOTE — Progress Notes (Signed)
[facility] MD Progress Note  04/01/2021 12:36 PM Shawn Ford  MRN:  [id number]  CC: SI and delusions  Subjective:  Shawn Ford is a 30 y.o. male, with reported past psychiatric history of bipolar 2 disorder, PTSD, alcohol abuse, marijuana abuse and autism spectrum disorder. Patient presents voluntarily to [facility] (03/27/2021), transferred from [facility] ED after medical clearance for SI. [facility] stay day 5.   24hr events:  The patient's chart was reviewed and nursing notes were reviewed. The patient's case was discussed in multidisciplinary team meeting.  Per MAR: - Patient is compliant with scheduled meds. - PRNs: None Per RN notes, patient was COVID-negative, showed no symptoms COVID, was let off room restriction.  Attended groups. Patient slept Number of Hours: 5.25.   Today (04/01/2021): The patient was seen and evaluated on the unit.  On assessment today the patient reported that he feels better now that room restriction has been discontinued.  Stated that he slept "good", mood is "okay", and appetite is "good". Discussed with patient about [facility] again, and he was able to remember going there, staying overnight for monitoring.  Also remembered conversations with his mom about SI and AH, stated that he did that to bother her.  Patient reported that he is tired of making mistakes, and that he wants to get better.  Patient is amenable to [facility]I and discussed risk and benefits with patient.  Patient reported feeling drowsy in the morning, however discussed with patient he only got 5 hours of sleep.  Patient did not have any other side effects and voiced no complaints. Patient denied chest pain, shortness of breath, abdominal pain, nausea or vomiting. Patient denied SI/HI/AVH, paranoia, delusions, first rank symptoms.  Principal Problem: Bipolar II disorder (HCC) Diagnosis: Principal Problem:   Bipolar II disorder (HCC) Active Problems:   PTSD (post-traumatic stress  disorder)   Alcohol abuse   Marijuana abuse   Autism spectrum disorder  Total Time spent with patient: 20 minutes  Past Psychiatric History:  Previous psych diagnosis: Bipolar 2, PTSD, Asperger syndrome Current psych meds: Olanzapine, patient has not been compliant however Past suicide attempts: None Past homicidal behavior: None Past nonsuicidal self-harm: None Psych hospitalizations: None, this is her first Psych outpatient: Patient sees Dr. Jennelle Human at [facility] Past psych med trials: Lithium, Depakote, Tegretol, Zoloft, Olanzapine  Past Medical History:  Past Medical History:  Diagnosis Date   Bipolar 1 disorder (HCC)     Past Surgical History:  Procedure Laterality Date   ORIF RADIAL FRACTURE Left 01/24/2017   Procedure: OPEN REDUCTION INTERNAL FIXATION (ORIF) RADIAL FRACTURE;  Surgeon: Dominica Severin, MD;  Location: [facility];  Service: Orthopedics;  Laterality: Left;   TYMPANOSTOMY TUBE PLACEMENT Bilateral 1995   Family History: History reviewed. No pertinent family history. Family Psychiatric  History:  Mother reported that deceased father had similar presenting symptoms to patient in regards to mania and aggressive verbal outbursts  Social History:  Social History   Substance and Sexual Activity  Alcohol Use Yes   Alcohol/week: 24.0 standard drinks   Types: 24 Cans of beer per week   Comment: case a day     Social History   Substance and Sexual Activity  Drug Use Yes   Types: Marijuana, Cocaine    Social History   Socioeconomic History   Marital status: Single    Spouse name: Not on file   Number of children: Not on file   Years of education: Not on file  Highest education level: Not on file  Occupational History   Not on file  Tobacco Use   Smoking status: Every Day    Packs/day: 2.00    Types: Cigarettes   Smokeless tobacco: Never  Vaping Use   Vaping Use: Never used  Substance and Sexual Activity   Alcohol use: Yes     Alcohol/week: 24.0 standard drinks    Types: 24 Cans of beer per week    Comment: case a day   Drug use: Yes    Types: Marijuana, Cocaine   Sexual activity: Not Currently  Other Topics Concern   Not on file  Social History Narrative   Not on file   Social Determinants of Health   Financial Resource Strain: Not on file  Food Insecurity: Not on file  Transportation Needs: Not on file  Physical Activity: Not on file  Stress: Not on file  Social Connections: Not on file   Additional Social History:  Patient currently lives in an apartment with 3 other roommates.  However patient stated that his roommates are kicking him out.  Patient is currently unemployed, but was left of a large sum of money from his deceased father.  Sleep: Fair  Appetite:  Good  Current Medications: Current Facility-Administered Medications  Medication Dose Route Frequency Provider Last Rate Last Admin   amLODipine (NORVASC) tablet 5 mg  5 mg Oral Daily Antonieta Pert, MD   5 mg at 04/01/21 0755   melatonin tablet 3 mg  3 mg Oral QHS Princess Bruins, DO   3 mg at 03/31/21 2120   multivitamin with minerals tablet 1 tablet  1 tablet Oral Daily Princess Bruins, DO   1 tablet at 04/01/21 0755   nicotine (NICODERM CQ - dosed in mg/24 hours) patch 21 mg  21 mg Transdermal Daily Mason Jim, Amy E, MD   21 mg at 03/31/21 0908   paliperidone (INVEGA SUSTENNA) injection 234 mg  234 mg Intramuscular Once Princess Bruins, DO       risperiDONE (RISPERDAL) tablet 2 mg  2 mg Oral Daily Princess Bruins, DO   2 mg at 04/01/21 0756   And   risperiDONE (RISPERDAL) tablet 3 mg  3 mg Oral QHS Princess Bruins, DO   3 mg at 03/31/21 2120   thiamine tablet 100 mg  100 mg Oral Daily Princess Bruins, DO   100 mg at 04/01/21 7829    Lab Results:  Results for orders placed or performed during the hospital encounter of 03/27/21 (from the past 48 hour(s))  Resp Panel by RT-PCR (Flu A&B, Covid) Nasopharyngeal Swab     Status: None   Collection  Time: 03/31/21  1:20 PM   Specimen: Nasopharyngeal Swab; Nasopharyngeal(NP) swabs in vial transport medium  Result Value Ref Range   SARS Coronavirus 2 by RT PCR NEGATIVE NEGATIVE    Comment: (NOTE) SARS-CoV-2 target nucleic acids are NOT DETECTED.  The SARS-CoV-2 RNA is generally detectable in upper respiratory specimens during the acute phase of infection. The lowest concentration of SARS-CoV-2 viral copies this assay can detect is 138 copies/mL. A negative result does not preclude SARS-Cov-2 infection and should not be used as the sole basis for treatment or other patient management decisions. A negative result may occur with  improper specimen collection/handling, submission of specimen other than nasopharyngeal swab, presence of viral mutation(s) within the areas targeted by this assay, and inadequate number of viral copies(<138 copies/mL). A negative result must be combined with clinical observations, patient history, and  epidemiological information. The expected result is Negative.  Fact Sheet for Patients:  BloggerCourse.comhttps://www.fda.gov/media/152166/download  Fact Sheet for Healthcare Providers:  SeriousBroker.ithttps://www.fda.gov/media/152162/download  This test is no t yet approved or cleared by the [place] FDA and  has been authorized for detection and/or diagnosis of SARS-CoV-2 by FDA under an Emergency Use Authorization (EUA). This EUA will remain  in effect (meaning this test can be used) for the duration of the COVID-19 declaration under Section 564(b)(1) of the Act, 21 U.S.C.section 360bbb-3(b)(1), unless the authorization is terminated  or revoked sooner.       Influenza A by PCR NEGATIVE NEGATIVE   Influenza B by PCR NEGATIVE NEGATIVE    Comment: (NOTE) The Xpert Xpress SARS-CoV-2/FLU/RSV plus assay is intended as an aid in the diagnosis of influenza from Nasopharyngeal swab specimens and should not be used as a sole basis for treatment. Nasal washings and aspirates are  unacceptable for Xpert Xpress SARS-CoV-2/FLU/RSV testing.  Fact Sheet for Patients: BloggerCourse.comhttps://www.fda.gov/media/152166/download  Fact Sheet for Healthcare Providers: SeriousBroker.ithttps://www.fda.gov/media/152162/download  This test is not yet approved or cleared by the [place] FDA and has been authorized for detection and/or diagnosis of SARS-CoV-2 by FDA under an Emergency Use Authorization (EUA). This EUA will remain in effect (meaning this test can be used) for the duration of the COVID-19 declaration under Section 564(b)(1) of the Act, 21 U.S.C. section 360bbb-3(b)(1), unless the authorization is terminated or revoked.  Performed at [facility], [address] [place], [place] [address]   Comprehensive metabolic panel     Status: Abnormal   Collection Time: 03/31/21  6:10 PM  Result Value Ref Range   Sodium 137 135 - 145 mmol/L   Potassium 4.0 3.5 - 5.1 mmol/L   Chloride 103 98 - 111 mmol/L   CO2 25 22 - 32 mmol/L   Glucose, Bld 122 (H) 70 - 99 mg/dL    Comment: Glucose reference range applies only to samples taken after fasting for at least 8 hours.   BUN 18 6 - 20 mg/dL   Creatinine, Ser 6.041.06 0.61 - 1.24 mg/dL   Calcium 9.1 8.9 - 54.010.3 mg/dL   Total Protein 7.4 6.5 - 8.1 g/dL   Albumin 4.7 3.5 - 5.0 g/dL   AST 16 15 - 41 U/L   ALT 15 0 - 44 U/L   Alkaline Phosphatase 72 38 - 126 U/L   Total Bilirubin 0.6 0.3 - 1.2 mg/dL   GFR, Estimated >98>60 >11>60 mL/min    Comment: (NOTE) Calculated using the CKD-EPI Creatinine Equation (2021)    Anion gap 9 5 - 15    Comment: Performed at [facility], [address] [place], [place] [address]    Blood Alcohol level:  Lab Results  Component Value Date   ETH <10 03/26/2021   ETH <10 03/03/2021    Metabolic Disorder Labs: Lab Results  Component Value Date   HGBA1C 5.5 03/03/2021   MPG 111.15 03/03/2021   No results found for: PROLACTIN Lab Results  Component Value Date   CHOL 142  03/03/2021   TRIG 84 03/03/2021   HDL 44 03/03/2021   CHOLHDL 3.2 03/03/2021   VLDL 17 03/03/2021   LDLCALC 81 03/03/2021    Physical Findings: AIMS: Facial and Oral Movements Muscles of Facial Expression: None, normal Lips and Perioral Area: None, normal Jaw: None, normal Tongue: None, normal,Extremity Movements Upper (arms, wrists, hands, fingers): None, normal Lower (legs, knees, ankles, toes): None, normal, Trunk Movements Neck, shoulders, hips:  None, normal, Overall Severity Severity of abnormal movements (highest score from questions above): None, normal Incapacitation due to abnormal movements: None, normal Patient's awareness of abnormal movements (rate only patient's report): No Awareness, Dental Status Current problems with teeth and/or dentures?: No Does patient usually wear dentures?: No  CIWA:  CIWA-Ar Total: 0  Musculoskeletal: Strength & Muscle Tone: within normal limits Gait & Station: normal, steady Patient leans: N/A  Psychiatric Specialty Exam:  Presentation  General Appearance: Appropriate for Environment; Casual; Fairly Groomed  Eye Contact:Good  Speech:Clear and Coherent; Normal Rate  Speech Volume:Normal  Handedness:Right   Mood and Affect  Mood:Euthymic stated that he feels better after being let off of isolation.  Affect:Congruent   Thought Process  Thought Processes:Coherent; Goal Directed; Linear  Descriptions of Associations:Intact  Orientation:Full (Time, Place and Person)  Thought Content:Logical Patient denied SI/HI/AVH, delusions, paranoia, first rank symptoms. Patient is not grossly responding to internal/external stimuli, did not make delusional comments on exam.  History of Schizophrenia/Schizoaffective disorder:No  Duration of Psychotic Symptoms:N/A  Hallucinations:Hallucinations: None  Ideas of Reference:None  Suicidal Thoughts:Suicidal Thoughts: No  Homicidal Thoughts:Homicidal Thoughts: No   Sensorium   Memory:Immediate Good; Recent Fair; Remote Fair  Judgment:Fair  Insight:Shallow   Executive Functions  Concentration:Good  Attention Span:Fair  Recall:Fair  Fund of Knowledge:Fair  Language:Good   Psychomotor Activity  Psychomotor Activity:Psychomotor Activity: Normal No muscle stiffness, restlessness, tremors, involuntary movements on physical exam.   Assets  Assets:Communication Skills; Desire for Improvement; Resilience   Sleep  Sleep:Sleep: Fair Number of Hours of Sleep: 5.25    Physical Exam: Physical Exam Vitals and nursing note reviewed.  HENT:     Head: Normocephalic.  Pulmonary:     Effort: Pulmonary effort is normal.  Neurological:     Mental Status: He is alert and oriented to person, place, and time.   Review of Systems  Respiratory:  Negative for shortness of breath.   Cardiovascular:  Negative for chest pain.  Gastrointestinal:  Negative for abdominal pain, constipation, diarrhea, nausea and vomiting.  Neurological:  Negative for dizziness and headaches.  Blood pressure (!) 135/104, pulse (!) 103, temperature 97.8 F (36.6 C), temperature source Oral, resp. rate 18, height 5' 9.5" (1.765 m), weight 71.7 kg, SpO2 98 %. Body mass index is 23 kg/m.   Treatment Plan Summary: Daily contact with patient to assess and evaluate symptoms and progress in treatment and Medication management  Shawn Ford is a 30 y.o. male, with reported past psychiatric history of bipolar 2 disorder, PTSD, alcohol abuse, marijuana abuse and autism spectrum disorder. Patient presents voluntarily to [facility] (03/27/2021), transferred from [facility] ED after medical clearance for SI. BHH stay day 5.     ASSESSMENT:  Diagnosis:  - Bipolar disorder - PTSD - Polysubstance abuse - Autism spectrum disorder - Hypertension - COVID-resolved  TODAY (04/01/2021): Patient was seen sitting on his bed.  More pleasant and cooperative on interview  today.  No change on insight about his previous [facility] visit. Will keep close her eyes on possible EtOH/benzos withdrawal symptoms.  We will also keep an eye on his BP.   PSYCHIATRIC DIAGNOSIS & TREATMENT:  Bipolar disorder Continue Risperdal 2 mg p.o. daily and Risperdal 3 mg p.o. nightly Start Invega Sustenna 234 mg IM (04/01/2021)             CBC within normal limits (8/6)             CMP: glc 122, otherwise within normal limits (  8/11)        Lipid panel within normal limits (7/14)             Hb A1c 5.5 (7/14)  EKG: NSR, QTc 426 (8/9) Continue melatonin 3 mg p.o. nightly     Polysubstance abuse (alcohol, marijuana, tobacco) Nicotine 21 mg patches Thiamine 100 milligrams p.o. daily Multivitamin daily Discussed risk and encouraged cessation.             BAL <10 (8/6)             UDS positive for THC (8/6) Discontinue CIWA protocol (8/12) CIWA 12 (8/9) CIWA less than or equal to 4 since 8/10   MEDICAL ISSUES BEING ADDRESSED:  Hypertension Clonidine 0.1 mg p.o. 1 dose today (8/10) Continue amlodipine 5 mg p.o. daily CMP Cr 1.06 (8/11)  COVID-RESOLVED COVID RT-PCR test: negative (8/11) Discontinue airborne and contact precautions   PRN's -Trazadone 50mg  PO qHS PRN for insomnia -Hydroxyzine 25mg  PO TID PRN for anxiety -Alum & mag hydroxide-simeth 34ml PO qHS PRN for GERD -Magnesium hydroxide 42ml PO daily PRN for constipation -Acetaminophen tablet 650mg  PO PRN q6hrs for mild pain   Discharge Planning: -Social work and case management to assist with discharge planning and identification of hospital follow-up needs prior to discharge -Estimated LOS: 3-4 days -Discharge Concerns: Need to establish a safety plan; Medication compliance and effectiveness -Discharge Goals: Return home with outpatient referrals for mental health follow-up including medication management/psychotherapy  Safety and Monitoring: -Voluntary admission to inpatient psychiatric unit for safety,  stabilization and treatment -Daily contact with patient to assess and evaluate symptoms and progress in treatment -Patient's case to be discussed in multi-disciplinary team meeting -Observation Level : q15 minute checks -Vital signs: q12 hours -Precautions: suicide, elopement, and assault   31m, DO, PGY-1 04/01/2021, 12:36 PM

## 2021-04-01 NOTE — Progress Notes (Signed)
Patient did attend the evening speaker AA meeting.  

## 2021-04-01 NOTE — Plan of Care (Signed)
  Problem: Self-Concept: Goal: Level of anxiety will decrease Outcome: Progressing   Problem: Activity: Goal: Interest or engagement in leisure activities will improve Outcome: Progressing   Problem: Coping: Goal: Coping ability will improve Outcome: Progressing   

## 2021-04-01 NOTE — BHH Group Notes (Signed)
Type of Therapy and Topic: Group Therapy: Overcoming Obstacles  Participation Level: Active  Description of Group: In this group patients will be encouraged to explore what they see as obstacles to their own wellness and recovery. They will be guided to discuss their thoughts, feelings, and behaviors related to these obstacles. The group will process together ways to cope with barriers, with attention given to specific choices patients can make. Each patient will be challenged to identify changes they are motivated to make in order to overcome their obstacles. This group will be process-oriented, with patients participating in exploration of their own experiences as well as giving and receiving support and challenge from other group members.  Therapeutic Goals: 1. Patient will identify personal and current obstacles as they relate to admission. 2. Patient will identify barriers that currently interfere with their wellness or overcoming obstacles. 3. Patient will identify feelings, thought process and behaviors related to these barriers. 4. Patient will identify two changes they are willing to make to overcome these obstacles:  Summary of Patient Progress: Shawn Ford stated that one of his obstacles is trying to not to "fall off the wagon" or hurt people in his life again..  The Pt participated in the group discussion and accepted the worksheet that was provided.  The Pt demonstrated knowledge and understanding of the subject and how it applies to their own life.

## 2021-04-01 NOTE — Progress Notes (Signed)
   D:  Pt presents with minimal anxiety and high depression (8/10).  Pt denies SI/HI and verbally contracts for safety.  Pt denies AVH.  A:  Labs/Vitals monitored; Medication administered; Pt encouraged to communicate concerns.  R:  Pt remains safe on unit with Q15 minute safety checks.

## 2021-04-01 NOTE — BHH Counselor (Signed)
CSW provided the Pt with a packet that includes: housing and shelter resources, free/reduced price food resources, [employer] information, [employer], and suicide prevention information.

## 2021-04-02 DIAGNOSIS — F3181 Bipolar II disorder: Secondary | ICD-10-CM | POA: Diagnosis not present

## 2021-04-02 MED ORDER — ACETAMINOPHEN 325 MG PO TABS
650.0000 mg | ORAL_TABLET | Freq: Four times a day (QID) | ORAL | Status: DC | PRN
  Administered 2021-04-02: 650 mg via ORAL
  Filled 2021-04-02: qty 2

## 2021-04-02 MED ORDER — RISPERIDONE 2 MG PO TABS
4.0000 mg | ORAL_TABLET | Freq: Every day | ORAL | Status: DC
  Administered 2021-04-02: 4 mg via ORAL
  Filled 2021-04-02 (×4): qty 2

## 2021-04-02 MED ORDER — PALIPERIDONE PALMITATE ER 156 MG/ML IM SUSY
156.0000 mg | PREFILLED_SYRINGE | Freq: Once | INTRAMUSCULAR | Status: DC
  Filled 2021-04-02 (×2): qty 1

## 2021-04-02 MED ORDER — AMLODIPINE BESYLATE 5 MG PO TABS
5.0000 mg | ORAL_TABLET | ORAL | Status: AC
  Administered 2021-04-02: 5 mg via ORAL
  Filled 2021-04-02: qty 1

## 2021-04-02 MED ORDER — MELATONIN 5 MG PO TABS
5.0000 mg | ORAL_TABLET | Freq: Every day | ORAL | Status: DC
  Administered 2021-04-02: 5 mg via ORAL
  Filled 2021-04-02 (×3): qty 1
  Filled 2021-04-02: qty 7

## 2021-04-02 MED ORDER — AMLODIPINE BESYLATE 10 MG PO TABS
10.0000 mg | ORAL_TABLET | Freq: Every day | ORAL | Status: DC
  Administered 2021-04-03: 10 mg via ORAL
  Filled 2021-04-02 (×4): qty 1

## 2021-04-02 MED ORDER — RISPERIDONE 2 MG PO TABS
2.0000 mg | ORAL_TABLET | Freq: Every day | ORAL | Status: DC
  Administered 2021-04-03: 2 mg via ORAL
  Filled 2021-04-02: qty 21
  Filled 2021-04-02 (×2): qty 1

## 2021-04-02 NOTE — Progress Notes (Signed)
  D:  Pt presents with mild anxiety (2/10) and moderate depression (5/10).  Pt attributes a better day to being able to get out side and shoot hoops. Pt reports being hopeful about possible discharge on 8/14 or 8/15.  Pt states, "ready to get a job and get back out there."  Pt denies SI/HI, and verbally contracts for safety.  Pt denies AVH.  A:  Lab/Vitals monitored; Medication administered; Pt encouraged to communicate his concerns.  R:  Pt remains safe on unit with Q 15 minute safety checks.

## 2021-04-02 NOTE — [facility] Group Notes (Signed)
LCSW Group Therapy Note 04/02/2021  10:00-11:00am  Type of Therapy and Topic:  Group Therapy: Anger and Commonalities  Participation Level:  Active   Description of Group: In this group, patients initially shared an "unknown" fact about themselves and CSW led a discussion about the ways in which we have things in common without realizing it.  Patient then identified a recent time they became angry and how this yet again showed a way in which they had something in common with other patients.  We discussed possible unhealthy reactions to anger and possible healthy reactions.  We also discussed possible underlying emotions that lead to the anger.  Commonalities among group members were pointed out throughout the entirety of group.  Therapeutic Goals: Patients were asked to share something about themselves and learned that they often have things in common with other people without knowing this Patients will remember their last incident of anger and how they reacted Patients will be able to identify their reaction as healthy or unhealthy, and identify possible reactions that would have been the opposite Patients will learn that anger itself is a secondary emotion and will think about their primary emotion at the time of their last incident of anger  Summary of Patient Progress:  The patient shared that something interesting he could share about himself is that he used to be a kick boxer.  This interest in sports was expressed as also present in several other patients, so the patient was able to recognize that he is not alone.  The patient also said a frequent cause of anger is knowing that while his father was dying, he never told the patient so he did not get a chance to say goodbye.  He said he has worked on forgiving his father, but talked about how he has made a lot of bad decisions in his including drug/alcohol use himself, going off medicines, and going on shopping sprees.  His conclusion by the end  of group was that these chosen methods of coping are unhealthy.  He stated that his intention is to put in place specific boundaries, be honest with everyone about what is going on, and stay on his medicines at discharge as a means to help himself.  Therapeutic Modalities:   Cognitive Behavioral Therapy  Lynnell Chad, LCSW 04/02/2021  9:45 AM

## 2021-04-02 NOTE — Progress Notes (Signed)
[facility] MD Progress Note  04/02/2021 2:34 PM Shawn Ford  MRN:  [id number] Subjective: Patient is a 30 year old male with a past psychiatric history significant for bipolar disorder, posttraumatic stress disorder, alcohol abuse, marijuana abuse as well as autism spectrum disorder who presented for admission to the [facility] on 03/27/2021 secondary to suicidal ideation.  Objective: Patient is seen and examined.  Patient is a 30 year old male with the above-stated past psychiatric history who is seen in follow-up.  The patient received the long-acting Invega injection yesterday.  He stated that he had no side effects except a dry mouth and some soreness at the injection site.  His sleep is improved since we increased his oral Risperdal to 4 mg p.o. nightly.  He denied any auditory or visual hallucinations today.  He stated he is looking forward to being able to go to the dining hall instead of eating in the room because of the COVID restrictions.  He also wanted to know if he could go to the day room.  His blood pressure initially this morning was 129/87.  He was mildly tachycardic with a rate of 115.  Repeat blood pressure was 135/99 and his heart rate was 124.  He slept 5.75 hours last night.  His CIWA this morning was 0.  He had a CBC done on 8/11 that was essentially normal.  His hemoglobin A1c from 7/14 was 5.5.  TSH was 1.309.  HIV was negative.  Drug screen was negative except for marijuana.  EKG showed a normal sinus rhythm with a normal QTc interval.  Principal Problem: Bipolar II disorder (HCC) Diagnosis: Principal Problem:   Bipolar II disorder (HCC) Active Problems:   PTSD (post-traumatic stress disorder)   Alcohol abuse   Marijuana abuse   Autism spectrum disorder  Total Time spent with patient: 20 minutes  Past Psychiatric History: See admission H&P  Past Medical History:  Past Medical History:  Diagnosis Date   Bipolar 1 disorder (HCC)     Past Surgical  History:  Procedure Laterality Date   ORIF RADIAL FRACTURE Left 01/24/2017   Procedure: OPEN REDUCTION INTERNAL FIXATION (ORIF) RADIAL FRACTURE;  Surgeon: Dominica Severin, MD;  Location: [facility];  Service: Orthopedics;  Laterality: Left;   TYMPANOSTOMY TUBE PLACEMENT Bilateral 1995   Family History: History reviewed. No pertinent family history. Family Psychiatric  History: See admission H&P Social History:  Social History   Substance and Sexual Activity  Alcohol Use Yes   Alcohol/week: 24.0 standard drinks   Types: 24 Cans of beer per week   Comment: case a day     Social History   Substance and Sexual Activity  Drug Use Yes   Types: Marijuana, Cocaine    Social History   Socioeconomic History   Marital status: Single    Spouse name: Not on file   Number of children: Not on file   Years of education: Not on file   Highest education level: Not on file  Occupational History   Not on file  Tobacco Use   Smoking status: Every Day    Packs/day: 2.00    Types: Cigarettes   Smokeless tobacco: Never  Vaping Use   Vaping Use: Never used  Substance and Sexual Activity   Alcohol use: Yes    Alcohol/week: 24.0 standard drinks    Types: 24 Cans of beer per week    Comment: case a day   Drug use: Yes    Types: Marijuana, Cocaine   Sexual  activity: Not Currently  Other Topics Concern   Not on file  Social History Narrative   Not on file   Social Determinants of Health   Financial Resource Strain: Not on file  Food Insecurity: Not on file  Transportation Needs: Not on file  Physical Activity: Not on file  Stress: Not on file  Social Connections: Not on file   Additional Social History:                         Sleep: Fair  Appetite:  Good  Current Medications: Current Facility-Administered Medications  Medication Dose Route Frequency Provider Last Rate Last Admin   amLODipine (NORVASC) tablet 5 mg  5 mg Oral Daily Antonieta Pertlary, Casy Brunetto Lawson, MD   5 mg at  04/02/21 [id number]   melatonin tablet 3 mg  3 mg Oral QHS Princess BruinsNguyen, Julie, DO   3 mg at 04/01/21 2110   multivitamin with minerals tablet 1 tablet  1 tablet Oral Daily Princess BruinsNguyen, Julie, DO   1 tablet at 04/02/21 [id number]   nicotine (NICODERM CQ - dosed in mg/24 hours) patch 21 mg  21 mg Transdermal Daily Mason JimSingleton, Amy E, MD   21 mg at 04/02/21 0812   [START ON 04/05/2021] paliperidone (INVEGA SUSTENNA) injection 156 mg  156 mg Intramuscular Once Bartholomew CrewsSingleton, Amy E, MD       risperiDONE (RISPERDAL) tablet 2 mg  2 mg Oral Daily Princess BruinsNguyen, Julie, DO   2 mg at 04/02/21 [id number]   And   risperiDONE (RISPERDAL) tablet 3 mg  3 mg Oral QHS Princess BruinsNguyen, Julie, DO   3 mg at 04/01/21 2110   thiamine tablet 100 mg  100 mg Oral Daily Princess BruinsNguyen, Julie, DO   100 mg at 04/02/21 0813    Lab Results:  Results for orders placed or performed during the hospital encounter of 03/27/21 (from the past 48 hour(s))  Comprehensive metabolic panel     Status: Abnormal   Collection Time: 03/31/21  6:10 PM  Result Value Ref Range   Sodium 137 135 - 145 mmol/L   Potassium 4.0 3.5 - 5.1 mmol/L   Chloride 103 98 - 111 mmol/L   CO2 25 22 - 32 mmol/L   Glucose, Bld 122 (H) 70 - 99 mg/dL    Comment: Glucose reference range applies only to samples taken after fasting for at least 8 hours.   BUN 18 6 - 20 mg/dL   Creatinine, Ser 8.111.06 0.61 - 1.24 mg/dL   Calcium 9.1 8.9 - 91.410.3 mg/dL   Total Protein 7.4 6.5 - 8.1 g/dL   Albumin 4.7 3.5 - 5.0 g/dL   AST 16 15 - 41 U/L   ALT 15 0 - 44 U/L   Alkaline Phosphatase 72 38 - 126 U/L   Total Bilirubin 0.6 0.3 - 1.2 mg/dL   GFR, Estimated >78>60 >29>60 mL/min    Comment: (NOTE) Calculated using the CKD-EPI Creatinine Equation (2021)    Anion gap 9 5 - 15    Comment: Performed at [facility], [address] [place], [place] [id number]    Blood Alcohol level:  Lab Results  Component Value Date   [facility]   ETH <10 03/03/2021    Metabolic Disorder Labs: Lab Results  Component  Value Date   HGBA1C 5.5 03/03/2021   MPG 111.15 03/03/2021   No results found for: PROLACTIN Lab Results  Component Value Date   CHOL 142 03/03/2021   TRIG 84  03/03/2021   HDL 44 03/03/2021   CHOLHDL 3.2 03/03/2021   VLDL 17 03/03/2021   LDLCALC 81 03/03/2021    Physical Findings: AIMS: Facial and Oral Movements Muscles of Facial Expression: None, normal Lips and Perioral Area: None, normal Jaw: None, normal Tongue: None, normal,Extremity Movements Upper (arms, wrists, hands, fingers): None, normal Lower (legs, knees, ankles, toes): None, normal, Trunk Movements Neck, shoulders, hips: None, normal, Overall Severity Severity of abnormal movements (highest score from questions above): None, normal Incapacitation due to abnormal movements: None, normal Patient's awareness of abnormal movements (rate only patient's report): No Awareness, Dental Status Current problems with teeth and/or dentures?: No Does patient usually wear dentures?: No  CIWA:  CIWA-Ar Total: 0 COWS:     Musculoskeletal: Strength & Muscle Tone: within normal limits Gait & Station: normal Patient leans: N/A  Psychiatric Specialty Exam:  Presentation  General Appearance: Appropriate for Environment; Casual; Fairly Groomed  Eye Contact:Good  Speech:Clear and Coherent; Normal Rate  Speech Volume:Normal  Handedness:Right   Mood and Affect  Mood:Euthymic  Affect:Congruent   Thought Process  Thought Processes:Coherent; Goal Directed; Linear  Descriptions of Associations:Intact  Orientation:Full (Time, Place and Person)  Thought Content:Logical  History of Schizophrenia/Schizoaffective disorder:No  Duration of Psychotic Symptoms:N/A  Hallucinations:Hallucinations: None  Ideas of Reference:None  Suicidal Thoughts:Suicidal Thoughts: No  Homicidal Thoughts:Homicidal Thoughts: No   Sensorium  Memory:Immediate Good; Recent Fair; Remote  Fair  Judgment:Fair  Insight:Shallow   Executive Functions  Concentration:Good  Attention Span:Fair  Recall:Fair  Fund of Knowledge:Fair  Language:Good   Psychomotor Activity  Psychomotor Activity:Psychomotor Activity: Normal   Assets  Assets:Communication Skills; Desire for Improvement; Resilience   Sleep  Sleep:Sleep: Fair Number of Hours of Sleep: 5.25    Physical Exam: Physical Exam Vitals and nursing note reviewed.  Constitutional:      Appearance: Normal appearance.  HENT:     Head: Normocephalic and atraumatic.  Pulmonary:     Effort: Pulmonary effort is normal.  Neurological:     General: No focal deficit present.     Mental Status: He is alert and oriented to person, place, and time.   Review of Systems  All other systems reviewed and are negative. Blood pressure (!) 135/99, pulse (!) 124, temperature 97.8 F (36.6 C), temperature source Oral, resp. rate 18, height 5' 9.5" (1.765 m), weight 71.7 kg, SpO2 98 %. Body mass index is 23 kg/m.   Treatment Plan Summary: Daily contact with patient to assess and evaluate symptoms and progress in treatment, Medication management, and Plan patient is seen and examined.  Patient is a 30 year old male with the above-stated past psychiatric history who is seen in follow-up.  Diagnosis: 1.  Bipolar disorder type II 2.  Posttraumatic stress disorder. 3.  Alcohol use disorder. 4.  Marijuana use disorder. 5.  Autism spectrum disorder 6.  Essential hypertension  Pertinent findings on examination today: 1.  Patient tolerated the long-acting paliperidone injection 234 mg x 1. 2.  Sleep is still okay but not great. 3.  Patient denies suicidal or homicidal ideation. 4.  Patient denies auditory or visual hallucinations. 5.  Blood pressure remains mildly elevated.  Plan: 1.  Increase Norvasc to 10 mg p.o. daily for hypertension. 2.  Increase melatonin to 6 mg p.o. nightly for insomnia. 3.  Patient received  the paliperidone long-acting injectable medication 234 mg IM x1 on 04/01/2021 for psychosis. 4.  Patient is to receive the long-acting paliperidone injection 156 mg IM x1 on 8/16 for psychosis. 5.  Continue Risperdal 2 mg p.o. daily and increase nightly dosage to 4 mg p.o. nightly for psychosis and sleep. 6.  Continue thiamine 100 mg p.o. daily for nutritional supplementation. 7.  Disposition planning-in progress.  Antonieta Pert, MD 04/02/2021, 2:34 PM

## 2021-04-02 NOTE — Progress Notes (Signed)
[facility] Group Notes:  (Nursing/MHT/Case Management/Adjunct)  Date:  04/02/2021  Time:  2015  Type of Therapy:   wrap up group  Participation Level:  Active  Participation Quality:  Appropriate, Attentive, Sharing, and Supportive  Affect:  Appropriate  Cognitive:  Appropriate  Insight:  Improving  Engagement in Group:  Engaged  Modes of Intervention:  Clarification, Education, and Support  Summary of Progress/Problems: Positive thinking and positive change were discussed.   Marcille Buffy 04/02/2021, 9:13 PM

## 2021-04-03 MED ORDER — RISPERIDONE 2 MG PO TABS: 2.0000 mg | ORAL_TABLET | Freq: Every day | ORAL | 0 refills | Status: DC

## 2021-04-03 MED ORDER — RISPERIDONE 4 MG PO TABS: 4.0000 mg | ORAL_TABLET | Freq: Every day | ORAL | 0 refills | Status: DC

## 2021-04-03 MED ORDER — PALIPERIDONE PALMITATE ER 156 MG/ML IM SUSY: 156.0000 mg | PREFILLED_SYRINGE | Freq: Once | INTRAMUSCULAR | 0 refills | Status: DC

## 2021-04-03 MED ORDER — AMLODIPINE BESYLATE 10 MG PO TABS: 10.0000 mg | ORAL_TABLET | Freq: Every day | ORAL | 0 refills | Status: AC

## 2021-04-03 MED ORDER — HYDROXYZINE HCL 25 MG PO TABS
25.0000 mg | ORAL_TABLET | Freq: Three times a day (TID) | ORAL | Status: DC | PRN
  Administered 2021-04-03: 25 mg via ORAL
  Filled 2021-04-03 (×2): qty 1
  Filled 2021-04-03: qty 10
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
## 2021-04-03 NOTE — BHH Suicide Risk Assessment (Signed)
[facility] Discharge Suicide Risk Assessment   Principal Problem: Bipolar II disorder [facility]) Discharge Diagnoses: Principal Problem:   Bipolar II disorder (HCC) Active Problems:   PTSD (post-traumatic stress disorder)   Alcohol abuse   Marijuana abuse   Autism spectrum disorder   Total Time spent with patient: 15 minutes  Musculoskeletal: Strength & Muscle Tone: within normal limits Gait & Station: normal Patient leans: N/A  Psychiatric Specialty Exam  Presentation  General Appearance: Appropriate for Environment; Casual; Fairly Groomed  Eye Contact:Good  Speech:Clear and Coherent; Normal Rate  Speech Volume:Normal  Handedness:Right   Mood and Affect  Mood:Euthymic  Duration of Depression Symptoms: No data recorded Affect:Congruent   Thought Process  Thought Processes:Coherent; Goal Directed; Linear  Descriptions of Associations:Intact  Orientation:Full (Time, Place and Person)  Thought Content:Logical  History of Schizophrenia/Schizoaffective disorder:No  Duration of Psychotic Symptoms:N/A  Hallucinations:No data recorded Ideas of Reference:None  Suicidal Thoughts:No data recorded Homicidal Thoughts:No data recorded  Sensorium  Memory:Immediate Good; Recent Fair; Remote Fair  Judgment:Fair  Insight:Shallow   Executive Functions  Concentration:Good  Attention Span:Fair  Recall:Fair  Fund of Knowledge:Fair  Language:Good   Psychomotor Activity  Psychomotor Activity: No data recorded  Assets  Assets:Communication Skills; Desire for Improvement; Resilience   Sleep  Sleep: No data recorded  Physical Exam: Physical Exam Vitals and nursing note reviewed.  Constitutional:      Appearance: Normal appearance.  HENT:     Head: Normocephalic and atraumatic.  Pulmonary:     Effort: Pulmonary effort is normal.  Neurological:     General: No focal deficit present.     Mental Status: He is alert and oriented to person, place, and time.    Review of Systems  All other systems reviewed and are negative. Blood pressure 132/87, pulse (!) 133, temperature 97.8 F (36.6 C), temperature source Oral, resp. rate 18, height 5' 9.5" (1.765 m), weight 71.7 kg, SpO2 99 %. Body mass index is 23 kg/m.  Mental Status Per Nursing Assessment::   On Admission:  Suicidal ideation indicated by patient, Suicidal ideation indicated by others, Self-harm thoughts  Demographic Factors:  Male, Caucasian, and Unemployed  Loss Factors: NA  Historical Factors: Impulsivity  Risk Reduction Factors:   Living with another person, especially a relative, Positive social support, and Positive therapeutic relationship  Continued Clinical Symptoms:  Severe Anxiety and/or Agitation Depression:   Comorbid alcohol abuse/dependence Impulsivity Alcohol/Substance Abuse/Dependencies More than one psychiatric diagnosis Previous Psychiatric Diagnoses and Treatments  Cognitive Features That Contribute To Risk:  Thought constriction (tunnel vision)    Suicide Risk:  Minimal: No identifiable suicidal ideation.  Patients presenting with no risk factors but with morbid ruminations; may be classified as minimal risk based on the severity of the depressive symptoms   Follow-up Information     [facility]. Go on 04/04/2021.   Specialty: Behavioral Health Why: You have an appointment for medication management services on 04/04/21 at 10:20 am.  This appointment will be held in person. Contact information: [address] [place] [address] [phone]         [facility]. Go on 04/08/2021.   Why: You have an appointment on 04/08/21 at 11:00 am for therapy services.  This appointment will be held in person. Contact information: [address] [place], [place] [address]  P: [phone]        [facility]. Call.   Specialty: Hospice and Palliative Medicine Why: Please call this provider personally to  schedule an  appointment for bereavement/grief counseling services. Contact information: [address] [place] [address] [phone]                Plan Of Care/Follow-up recommendations:  Activity:  ad lib  Antonieta Pert, MD 04/03/2021, 9:59 AM

## 2021-04-03 NOTE — Progress Notes (Signed)
     04/02/21 2131  Psych Admission Type (Psych Patients Only)  Admission Status Voluntary  Psychosocial Assessment  Patient Complaints Anxiety;Depression  Eye Contact Brief  Facial Expression Anxious  Affect Anxious  Speech Logical/coherent  Interaction Assertive  Motor Activity Fidgety  Appearance/Hygiene In scrubs  Behavior Characteristics Restless  Mood Pleasant;Anxious  Thought Process  Coherency WDL  Content WDL  Delusions Paranoid  Perception WDL  Hallucination None reported or observed  Judgment Limited  Confusion None  Danger to Self  Current suicidal ideation? Denies  Danger to Others  Danger to Others None reported or observed

## 2021-04-03 NOTE — Progress Notes (Signed)
     04/03/21 0621  Vital Signs  Pulse Rate (!) 144  BP (!) 132/91  BP Location Right Arm  BP Method Automatic  Patient Position (if appropriate) Standing    Notified Provider of elevated HR.  Pt presented very anxious this morning.  Asked if pt needed anything for anxiety, pt replied, "no."  Pt reported that, "he didn't understand why it took so long to do everything."  Pt reported, "I'm tired of eating in my room."  Offered emotional support and encouraged to calm down.    Provider to added PRN Vistaril to [facility] for pt.

## 2021-04-03 NOTE — BHH Group Notes (Signed)
BHH LCSW Group Therapy Note  04/03/2021    Type of Therapy and Topic:  Group Therapy:  Adding Supports Including Yourself  Participation Level:  Active   Description of Group:   Patients in this group were introduced to the concept that additional supports including self-support are an essential part of recovery.  Patients listed their current healthy and unhealthy supports, and discussed the difference between the two.   Several songs were played and a group discussion ensued in which patients stated they could relate to the songs which inspired them to realize they have be willing to help themselves in order to succeed, because other people cannot achieve sobriety or stability for them.  Parents were encouraged toward self-advocacy and self-support as part of their recovery.  They discussed their reactions to these songs' messages, which were positive and hopeful.  Before group ended, they identified the supports they believe they need to add to their lives to achieve their goals at discharge.   Therapeutic Goals: 1)  explain the difference between healthy and unhealthy supports and discuss what specific supports are currently in patients' lives 2)  demonstrate the importance of being a key part of one's own support system 3)  discuss the need for appropriate boundaries with supports 4)  elicit ideas from patients about supports that need to be added in order to achieve goals   Summary of Patient Progress:   The patient expressed that supports he needs to add at discharge include church and using his faith more often, also referred to as a Transport planner.  He also wants to continue going to [employer] and get a permanent sponsor..    Therapeutic Modalities:   Motivational Interviewing Activity  Lynnell Chad

## 2021-04-03 NOTE — Progress Notes (Signed)
  [facility] Adult Case Management Discharge Plan :  Will you be returning to the same living situation after discharge:  No.  Is going to a [facility] At discharge, do you have transportation home?: Yes,  parents Do you have the ability to pay for your medications: No.  Release of information consent forms completed and emailed to Medical Records, then turned in to Medical Records by CSW.   Patient to Follow up at:  Follow-up Information     [facility]. Go on 04/04/2021.   Specialty: Behavioral Health Why: You have an appointment for medication management services on 04/04/21 at 10:20 am.  This appointment will be held in person. Contact information: [address] [place] [address] [phone]         [facility]. Go on 04/08/2021.   Why: You have an appointment on 04/08/21 at 11:00 am for therapy services.  This appointment will be held in person. Contact information: [address] [place], [place] [address]  P: [phone]        [facility]. Call.   Specialty: Hospice and Palliative Medicine Why: Please call this provider personally to schedule an appointment for bereavement/grief counseling services. Contact information: [address] [facility] [place] [address] [phone]                Next level of care provider has access to [facility] Link:no  Safety Planning and Suicide Prevention discussed: Yes,  with mother     Has patient been referred to the Quitline?: Patient refused referral  Patient has been referred for addiction treatment: Yes  Lynnell Chad, LCSW 04/03/2021, 12:07 PM

## 2021-04-03 NOTE — Progress Notes (Signed)
D: Pt A & O X 4. Presents anxious but pleasant "just about leaving here".  Denies SI, HI, AVH and pain at this time. Pt D/C home as ordered. Bus passes X2 given at time of d/c and instructions provided to bus stop by Clinical research associate.  A: D/C instructions reviewed with pt including prescriptions, medication samples and follow up appointments; compliance encouraged. All belongings from locker 22 given to pt at time of departure. Scheduled and PRN medications given with verbal education and effects monitored. Safety checks maintained without incident till time of d/c.  R: Pt receptive to care. Compliant with medications when offered. Denies adverse drug reactions when assessed. Verbalized understanding related to d/c instructions. Signed belonging sheet in agreement with items received from locker. Ambulatory with a steady gait. Appears to be in no physical distress at time of departure.

## 2021-04-04 ENCOUNTER — Ambulatory Visit: Payer: Self-pay | Admitting: Adult Health

## 2021-04-05 ENCOUNTER — Telehealth: Payer: Self-pay | Admitting: Adult Health

## 2021-04-05 NOTE — Telephone Encounter (Signed)
Pt called in stating that he was discharged a couple of days ago and while he was there he was given a shot of Paliperidone. He inquired about receiving the same shot here. He said he would wait until appt to discuss further. Appt 8/26. Ph: [phone]

## 2021-04-05 NOTE — Telephone Encounter (Signed)
I've only seen this patient once - previous patient of Dr. Jennelle Human. I'm sure we can set him up for injection if needed.

## 2021-04-06 ENCOUNTER — Other Ambulatory Visit: Payer: Self-pay | Admitting: Psychiatry

## 2021-04-06 ENCOUNTER — Other Ambulatory Visit (HOSPITAL_COMMUNITY): Payer: Self-pay

## 2021-04-06 MED ORDER — INVEGA SUSTENNA 156 MG/ML IM SUSY
PREFILLED_SYRINGE | INTRAMUSCULAR | 0 refills | Status: AC
  Filled 2021-04-06: qty 1, 30d supply, fill #0

## 2021-04-06 NOTE — Telephone Encounter (Signed)
Yes it was like this is a current patient.  Has seen [facility] once.  Has been seen here in the past a couple of years ago.  Is scheduled to see Edison Pace again soon.  According to the discharge instructions from the hospital he is due for the following: paliperidone 156 MG/ML Susy injection Commonly known as: INVEGA SUSTENNA Inject 1 mL (156 mg total) into the muscle once for 1 dose. Start taking on: April 05, 2021  I will check to see if it has been ordered and if not I will order it.

## 2021-04-06 NOTE — Telephone Encounter (Signed)
It looks like it was sent to the [facility] long outpatient pharmacy

## 2021-04-06 NOTE — Telephone Encounter (Signed)
Can you respond to mother please?

## 2021-04-06 NOTE — Telephone Encounter (Signed)
Dr. Jennelle Human please review. Asking about injection? Current pt?

## 2021-04-06 NOTE — Telephone Encounter (Signed)
Cherry mom [facility]) called to followup about the Respidal injections. States that he is overdue for the injection as he was supposed to get yesterday. She is also asking if that is something that is done here. Pls RTC [phone](Juvon) or mom [phone].

## 2021-04-07 ENCOUNTER — Other Ambulatory Visit (HOSPITAL_COMMUNITY): Payer: Self-pay

## 2021-04-07 NOTE — Telephone Encounter (Signed)
Please contact pt or Mom that it is okay to get injection at office and to set up time. Just check with me on times.. thanks

## 2021-04-07 NOTE — Telephone Encounter (Signed)
Update: pt does not currently have insurance but will starting in Sept. Mom< Shawn Ford, nurse with [facility], contacted mental health about getting the [place]. Due to the cost $2600.00/injection from [facility] and pt not having insurance, he could not afford. I instructed her on giving him the medication, she feels comfortable giving it until he gets in with [place] in Sept. Mom asked if someone could cancel his August apt and call him to set up apt in Sept. Shawn Ford is aware.   I apologized to Mom for all the back and forth phone calls and any confusion. She appreciated that.

## 2021-04-08 NOTE — Telephone Encounter (Signed)
Noted  

## 2021-04-15 ENCOUNTER — Ambulatory Visit: Payer: Self-pay | Admitting: Adult Health

## 2021-04-26 ENCOUNTER — Other Ambulatory Visit (HOSPITAL_COMMUNITY): Payer: Self-pay

## 2021-04-26 ENCOUNTER — Encounter: Payer: Self-pay | Admitting: Adult Health

## 2021-04-26 ENCOUNTER — Other Ambulatory Visit: Payer: Self-pay

## 2021-04-26 ENCOUNTER — Ambulatory Visit (INDEPENDENT_AMBULATORY_CARE_PROVIDER_SITE_OTHER): Payer: Self-pay | Admitting: Adult Health

## 2021-04-26 DIAGNOSIS — F3181 Bipolar II disorder: Secondary | ICD-10-CM

## 2021-04-26 DIAGNOSIS — G4700 Insomnia, unspecified: Secondary | ICD-10-CM

## 2021-04-26 DIAGNOSIS — F4310 Post-traumatic stress disorder, unspecified: Secondary | ICD-10-CM

## 2021-04-26 DIAGNOSIS — F1210 Cannabis abuse, uncomplicated: Secondary | ICD-10-CM

## 2021-04-26 MED ORDER — RISPERIDONE 4 MG PO TABS
4.0000 mg | ORAL_TABLET | Freq: Every day | ORAL | 2 refills | Status: AC
  Filled 2021-04-26: qty 30, 30d supply, fill #0
  Filled 2021-06-01: qty 30, 30d supply, fill #1

## 2021-04-26 MED ORDER — RISPERIDONE 2 MG PO TABS: 2.0000 mg | ORAL_TABLET | Freq: Every day | ORAL | 2 refills | Status: DC

## 2021-04-26 MED ORDER — RISPERIDONE 2 MG PO TABS
2.0000 mg | ORAL_TABLET | Freq: Every day | ORAL | 2 refills | Status: AC
  Filled 2021-04-26: qty 30, 30d supply, fill #0
  Filled 2021-06-01: qty 30, 30d supply, fill #1

## 2021-04-26 MED ORDER — RISPERIDONE 4 MG PO TABS: 4.0000 mg | ORAL_TABLET | Freq: Every day | ORAL | 2 refills | Status: DC

## 2021-04-26 NOTE — Progress Notes (Signed)
Shawn Ford [id number] 1991/06/30 30 y.o.  Subjective:   Patient ID:  Shawn Ford is a 30 y.o. (DOB 1991/05/14) male.  Chief Complaint: No chief complaint on file.   HPI  Shawn Ford presents to the office today for follow-up of BPD-2, PTSD, mild THC use, Autism spectrum and insomnia.  Describes mood today as "pretty good recently". Pleasant. Tearful at times - "more just grief". Mood symptoms - reports some depression - "mostly all year". Denies anxiety and irritability. Stating "I'm doing alright". Recently hospitalized at [facility] for paranoia and alcohol intoxication. Mother took him for evaluation - hospitalized for a week. Was started on Risperdal Consta, but could not afford it. Has continued on the Risperdal oral tablets 2mg /4mg  daily. Attending church with mother. Looking for a job - has applied to . Going to [employer]. Has a sponsor. Denies substance use. Denies self harm. Improved interest and motivation. Taking medications as prescribed.  Energy levels stable. Active, has a regular exercise routine - walking. Enjoys some usual interests and activities. Single. Lives in an apartment with room-mates.  Spending time with family. Appetite adequate. Weight stable - 163 pounds. Sleep has improved. Averages 8 hours. Focus and concentration "a lot better". Completing tasks. Managing aspects of household. 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 Denies SI or HI.  Denies AH or VH. Denies paranoia. Substance use - denies THC x 1 week and alcohol abuse x 1 month. History of cocaine use. History of "pill" use - Adderall.  Past Psychiatric Medication Trials: Lithium nausea, lamotrigine, Depakote with benefit, risperidone, sertraline, olanzapine 10, Viibryd, Abilify 10  History of not keeping regular appointments.   AIMS    Flowsheet Row [facility]  AIMS Total Score 0      AUDIT    Flowsheet Row  [facility]  Alcohol Use Disorder Identification Test Final Score (AUDIT) 32 (P)       Flowsheet Row [facility]) [facility] from 03/26/2021 in [facility] from [facility]03/2021 in [facility]  C-SSRS RISK CATEGORY Low Risk Low Risk High Risk        Review of Systems:  Review of Systems  Musculoskeletal:  Negative for gait problem.  Neurological:  Negative for tremors.  Psychiatric/Behavioral:         Please refer to HPI   Medications: I have reviewed the patient's current medications.  Current Outpatient Medications  Medication Sig Dispense Refill   amLODipine (NORVASC) 10 MG tablet Take 1 tablet (10 mg total) by mouth daily. 30 tablet 0   paliperidone (INVEGA SUSTENNA) 156 MG/ML SUSY injection Inject 1 mL (156 mg total) into the muscle once for 1 dose. 1 mL 0   paliperidone (INVEGA SUSTENNA) 156 MG/ML SUSY injection Inject 1 ml into the muscle once for 1 dose 1 mL 0   risperiDONE (RISPERDAL) 2 MG tablet Take 1 tablet (2 mg total) by mouth daily. 30 tablet 2   risperidone (RISPERDAL) 4 MG tablet Take 1 tablet (4 mg total) by mouth at bedtime. 30 tablet 2   No current facility-administered medications for this visit.    Medication Side Effects: None  Allergies: No Known Allergies  Past Medical History:  Diagnosis Date   Bipolar 1 disorder (HCC)     Past Medical History, Surgical history, Social history, and Family  history were reviewed and updated as appropriate.   Please see review of systems for further details on the patient's review from today.   Objective:   Physical Exam:  There were no vitals taken for this visit.  Physical Exam Constitutional:      General: He is not in acute distress. Musculoskeletal:        General: No deformity.  Neurological:     Mental  Status: He is alert and oriented to person, place, and time.     Coordination: Coordination normal.  Psychiatric:        Attention and Perception: Attention and perception normal. He does not perceive auditory or visual hallucinations.        Mood and Affect: Mood normal. Mood is not anxious or depressed. Affect is not labile, blunt, angry or inappropriate.        Speech: Speech normal.        Behavior: Behavior normal.        Thought Content: Thought content normal. Thought content is not paranoid or delusional. Thought content does not include homicidal or suicidal ideation. Thought content does not include homicidal or suicidal plan.        Cognition and Memory: Cognition and memory normal.        Judgment: Judgment normal.     Comments: Insight intact    Lab Review:     Component Value Date/Time   NA 137 03/31/2021 1810   K 4.0 03/31/2021 1810   CL 103 03/31/2021 1810   CO2 25 03/31/2021 1810   GLUCOSE 122 (H) 03/31/2021 1810   BUN 18 03/31/2021 1810   CREATININE 1.06 03/31/2021 1810   CALCIUM 9.1 03/31/2021 1810   PROT 7.4 03/31/2021 1810   ALBUMIN 4.7 03/31/2021 1810   AST 16 03/31/2021 1810   ALT 15 03/31/2021 1810   ALKPHOS 72 03/31/2021 1810   BILITOT 0.6 03/31/2021 1810   GFRNONAA >60 03/31/2021 1810   GFRAA >60 01/24/2017 0509       Component Value Date/Time   WBC 10.0 03/26/2021 1817   RBC 5.14 03/26/2021 1817   HGB 16.3 03/26/2021 1817   HCT 46.6 03/26/2021 1817   PLT 150 03/26/2021 1817   MCV 90.7 03/26/2021 1817   MCH 31.7 03/26/2021 1817   MCHC 35.0 03/26/2021 1817   RDW 13.5 03/26/2021 1817   LYMPHSABS 2.5 03/03/2021 2232   MONOABS 0.6 03/03/2021 2232   EOSABS 0.4 03/03/2021 2232   BASOSABS 0.1 03/03/2021 2232    No results found for: POCLITH, LITHIUM   No results found for: PHENYTOIN, PHENOBARB, VALPROATE, CBMZ   .res Assessment: Plan:    Plan:  PDMP reviewed  Risperdal 2mg  in the morning and 4mg  in the evening.  RTC 4 weeks  Patient  advised to contact office with any questions, adverse effects, or acute worsening in signs and symptoms.   Discussed potential metabolic side effects associated with atypical antipsychotics, as well as potential risk for movement side effects. Advised pt to contact office if movement side effects occur.    Diagnoses and all orders for this visit:  PTSD (post-traumatic stress disorder) -     Discontinue: risperidone (RISPERDAL) 4 MG tablet; Take 1 tablet (4 mg total) by mouth at bedtime. -     Discontinue: risperiDONE (RISPERDAL) 2 MG tablet; Take 1 tablet (2 mg total) by mouth daily. -     risperidone (RISPERDAL) 4 MG tablet; Take 1 tablet (4 mg total) by mouth at bedtime. -  risperiDONE (RISPERDAL) 2 MG tablet; Take 1 tablet (2 mg total) by mouth daily.  Mild tetrahydrocannabinol (THC) abuse  Bipolar II disorder (HCC) -     Discontinue: risperidone (RISPERDAL) 4 MG tablet; Take 1 tablet (4 mg total) by mouth at bedtime. -     Discontinue: risperiDONE (RISPERDAL) 2 MG tablet; Take 1 tablet (2 mg total) by mouth daily. -     risperidone (RISPERDAL) 4 MG tablet; Take 1 tablet (4 mg total) by mouth at bedtime. -     risperiDONE (RISPERDAL) 2 MG tablet; Take 1 tablet (2 mg total) by mouth daily.  Insomnia, unspecified type -     Discontinue: risperidone (RISPERDAL) 4 MG tablet; Take 1 tablet (4 mg total) by mouth at bedtime. -     Discontinue: risperiDONE (RISPERDAL) 2 MG tablet; Take 1 tablet (2 mg total) by mouth daily. -     risperidone (RISPERDAL) 4 MG tablet; Take 1 tablet (4 mg total) by mouth at bedtime. -     risperiDONE (RISPERDAL) 2 MG tablet; Take 1 tablet (2 mg total) by mouth daily.    Please see After Visit Summary for patient specific instructions.  No future appointments.   No orders of the defined types were placed in this encounter.   -------------------------------

## 2021-04-27 ENCOUNTER — Other Ambulatory Visit (HOSPITAL_COMMUNITY): Payer: Self-pay

## 2021-04-28 ENCOUNTER — Other Ambulatory Visit (HOSPITAL_COMMUNITY): Payer: Self-pay

## 2021-05-03 ENCOUNTER — Other Ambulatory Visit (HOSPITAL_COMMUNITY): Payer: Self-pay

## 2021-05-03 MED ORDER — AMLODIPINE BESYLATE 10 MG PO TABS
ORAL_TABLET | ORAL | 2 refills | Status: AC
  Filled 2021-05-03: qty 30, 30d supply, fill #0

## 2021-05-11 ENCOUNTER — Other Ambulatory Visit: Payer: Self-pay

## 2021-05-11 ENCOUNTER — Telehealth: Payer: Self-pay | Admitting: Adult Health

## 2021-05-11 ENCOUNTER — Other Ambulatory Visit (HOSPITAL_COMMUNITY): Payer: Self-pay

## 2021-05-11 MED ORDER — PALIPERIDONE PALMITATE ER 156 MG/ML IM SUSY
156.0000 mg | PREFILLED_SYRINGE | Freq: Once | INTRAMUSCULAR | 0 refills | Status: AC
  Filled 2021-05-11: qty 1, 30d supply, fill #0

## 2021-05-11 NOTE — Telephone Encounter (Signed)
Shawn Ford pharmacy called and said that the invega shot is not covered on his plan. They need to know what you want to do. Please call them at [phone]

## 2021-05-11 NOTE — Telephone Encounter (Signed)
I've been trying to reach his insurance and was on hold for over 20 minutes. Not sure of the plan or it's coverage of medication. Will try to call back.

## 2021-05-11 NOTE — Telephone Encounter (Signed)
Spoke with Shawn Ford today about his injection for [place] [employer]. At his last office visit he told Almira Coaster he could not afford the injection that [facility] started. He did get the injection last on Aug 19 but has had none since then. He does have insurance now, but not sure what kind of coverage he has for [place]. Can we research coverage for him and other options for lower cost? Possibly switch to other injection that is affordable or covered. Refer to Telephone Notes on 04/05/21. Check with provider [place] for follow up. Please call pt to advise. [phone].

## 2021-05-11 NOTE — Telephone Encounter (Signed)
Noted. He at least has the tablets while we are getting things figured out with insurance.

## 2021-05-11 NOTE — Telephone Encounter (Signed)
Can we discuss - it seems like it was covered - but no insurance at the time.

## 2021-05-17 ENCOUNTER — Other Ambulatory Visit: Payer: Self-pay

## 2021-05-17 ENCOUNTER — Encounter: Payer: Self-pay | Admitting: Adult Health

## 2021-05-17 ENCOUNTER — Ambulatory Visit (INDEPENDENT_AMBULATORY_CARE_PROVIDER_SITE_OTHER): Payer: PRIVATE HEALTH INSURANCE | Admitting: Adult Health

## 2021-05-17 DIAGNOSIS — Z8659 Personal history of other mental and behavioral disorders: Secondary | ICD-10-CM

## 2021-05-17 DIAGNOSIS — F1210 Cannabis abuse, uncomplicated: Secondary | ICD-10-CM

## 2021-05-17 DIAGNOSIS — F3181 Bipolar II disorder: Secondary | ICD-10-CM

## 2021-05-17 DIAGNOSIS — F1010 Alcohol abuse, uncomplicated: Secondary | ICD-10-CM

## 2021-05-17 DIAGNOSIS — G4700 Insomnia, unspecified: Secondary | ICD-10-CM

## 2021-05-17 DIAGNOSIS — F4310 Post-traumatic stress disorder, unspecified: Secondary | ICD-10-CM | POA: Diagnosis not present

## 2021-05-17 DIAGNOSIS — F840 Autistic disorder: Secondary | ICD-10-CM

## 2021-05-17 NOTE — Progress Notes (Signed)
[name] [id number] 09-Mar-1991 30 y.o.  Subjective:   Patient ID:  Shawn Ford is a 30 y.o. (DOB May 11, 1991) male.  Chief Complaint: No chief complaint on file.   HPI Shawn Ford presents to the office today for follow-up of BPD-2, PTSD, mild THC use, Autism spectrum and insomnia.  Describes mood today as "ok". Pleasant. Tearful at times - "the normal kind". Mood symptoms - reports some depression - "the normal kind" - "depressed about my dad". Denies anxiety and irritability. Stating "I'm doing pretty good - trying to". Feels like the Risperdal works well - taking 4mg  at bedtime and 2mg  in the morning. Could not get approved for injection through insurance. Attending church with mother. Starting a new job at . Going to . Has a sponsor. Denies substance use. Denies self harm. Improved interest and motivation. Taking medications as prescribed.  Energy levels stable. Active, has a regular exercise routine - walking. Enjoys some usual interests and activities. Single. Lives in an apartment with room-mates. Mother local. Has 2 sisters in college. Spending time with family. Appetite adequate. Weight gain - 163 to 181 pounds Sleep has improved. Averages 5 to 6 hours. Focus and concentration "a lot better". Completing tasks. Managing aspects of household. Denies SI or HI.  Denies AH or VH. Denies paranoia. Substance use - denies THC x 1 week and alcohol abuse x 1 month. History of cocaine use. History of "pill" use - Adderall. Denies alcohol use x 2 months - attends AA.  Past Psychiatric Medication Trials: Lithium nausea, lamotrigine, Depakote with benefit, risperidone, sertraline, olanzapine 10, Viibryd, Abilify 10   Flowsheet Row ED from 03/26/2021 in [facility] from 01/24/2021 in [facility]  C-SSRS RISK CATEGORY Low Risk No Risk        Review of Systems:  Review of Systems   Musculoskeletal:  Negative for gait problem.  Neurological:  Negative for tremors.  Psychiatric/Behavioral:         Please refer to HPI   Medications: I have reviewed the patient's current medications.  Current Outpatient Medications  Medication Sig Dispense Refill   amLODipine (NORVASC) 10 MG tablet Take 1 tablet (10 mg total) by mouth daily. 30 tablet 0   amLODipine (NORVASC) 10 MG tablet Take one tablet (10 mg dose) by mouth daily. 30 tablet 2   paliperidone (INVEGA SUSTENNA) 156 MG/ML SUSY injection Inject 1 ml into the muscle once for 1 dose 1 mL 0   paliperidone (INVEGA SUSTENNA) 156 MG/ML SUSY injection Inject 1 mL (156 mg total) into the muscle once for 1 dose. 1 mL 0   risperiDONE (RISPERDAL) 2 MG tablet Take 1 tablet (2 mg total) by mouth daily. 30 tablet 2   risperidone (RISPERDAL) 4 MG tablet Take 1 tablet (4 mg total) by mouth at bedtime. 30 tablet 2   No current facility-administered medications for this visit.    Medication Side Effects: None  Allergies: No Known Allergies  Past Medical History:  Diagnosis Date   Bipolar 1 disorder (HCC)     Past Medical History, Surgical history, Social history, and Family history were reviewed and updated as appropriate.   Please see review of systems for further details on the patient's review from today.   Objective:   Physical Exam:  There were no vitals taken for this visit.  Physical Exam Constitutional:      General: He is not in acute distress. Musculoskeletal:  General: No deformity.  Neurological:     Mental Status: He is alert and oriented to person, place, and time.     Coordination: Coordination normal.  Psychiatric:        Attention and Perception: Attention and perception normal. He does not perceive auditory or visual hallucinations.        Mood and Affect: Mood normal. Mood is not anxious or depressed. Affect is not labile, blunt, angry or inappropriate.        Speech: Speech normal.         Behavior: Behavior normal.        Thought Content: Thought content normal. Thought content is not paranoid or delusional. Thought content does not include homicidal or suicidal ideation. Thought content does not include homicidal or suicidal plan.        Cognition and Memory: Cognition and memory normal.        Judgment: Judgment normal.     Comments: Insight intact    Lab Review:     Component Value Date/Time   NA 137 03/31/2021 1810   K 4.0 03/31/2021 1810   CL 103 03/31/2021 1810   CO2 25 03/31/2021 1810   GLUCOSE 122 (H) 03/31/2021 1810   BUN 18 03/31/2021 1810   CREATININE 1.06 03/31/2021 1810   CALCIUM 9.1 03/31/2021 1810   PROT 7.4 03/31/2021 1810   ALBUMIN 4.7 03/31/2021 1810   AST 16 03/31/2021 1810   ALT 15 03/31/2021 1810   ALKPHOS 72 03/31/2021 1810   BILITOT 0.6 03/31/2021 1810   GFRNONAA >60 03/31/2021 1810   GFRAA >60 01/24/2017 0509       Component Value Date/Time   WBC 10.0 03/26/2021 1817   RBC 5.14 03/26/2021 1817   HGB 16.3 03/26/2021 1817   HCT 46.6 03/26/2021 1817   PLT 150 03/26/2021 1817   MCV 90.7 03/26/2021 1817   MCH 31.7 03/26/2021 1817   MCHC 35.0 03/26/2021 1817   RDW 13.5 03/26/2021 1817   LYMPHSABS 2.5 03/03/2021 2232   MONOABS 0.6 03/03/2021 2232   EOSABS 0.4 03/03/2021 2232   BASOSABS 0.1 03/03/2021 2232    No results found for: POCLITH, LITHIUM   No results found for: PHENYTOIN, PHENOBARB, VALPROATE, CBMZ   .res Assessment: Plan:    Plan:  PDMP reviewed  Risperdal 2mg  in the morning and 4mg  in the evening.  RTC 4 weeks  Patient advised to contact office with any questions, adverse effects, or acute worsening in signs and symptoms.   Discussed potential metabolic side effects associated with atypical antipsychotics, as well as potential risk for movement side effects. Advised pt to contact office if movement side effects occur.     Diagnoses and all orders for this visit:  PTSD (post-traumatic stress  disorder)  Mild tetrahydrocannabinol (THC) abuse  Bipolar II disorder (HCC)  History of Asperger's syndrome  Alcohol abuse  Insomnia, unspecified type  Autism spectrum disorder    Please see After Visit Summary for patient specific instructions.  Future Appointments  Date Time Provider Department Center  06/14/2021 11:20 AM Ellieanna Funderburg, , NP [facility] None    No orders of the defined types were placed in this encounter.   -------------------------------

## 2021-05-17 NOTE — Telephone Encounter (Signed)
I called his pharmacy benefit plan today. For his pharmacy plan, patient is only covered for generic. No brand meds or specialty meds are covered. Plan could see that [facility] is running for Brand. I'm not sure if there is a generic for this and if it would matter since they don't cover specialty medications.

## 2021-05-17 NOTE — Telephone Encounter (Signed)
Noted. Ty!

## 2021-05-18 NOTE — Telephone Encounter (Signed)
Noted thanks for calling Beth.

## 2021-06-01 ENCOUNTER — Other Ambulatory Visit (HOSPITAL_COMMUNITY): Payer: Self-pay

## 2021-06-14 ENCOUNTER — Ambulatory Visit (INDEPENDENT_AMBULATORY_CARE_PROVIDER_SITE_OTHER): Payer: PRIVATE HEALTH INSURANCE | Admitting: Adult Health

## 2021-06-14 ENCOUNTER — Other Ambulatory Visit: Payer: Self-pay

## 2021-06-14 ENCOUNTER — Encounter: Payer: Self-pay | Admitting: Adult Health

## 2021-06-14 DIAGNOSIS — Z8659 Personal history of other mental and behavioral disorders: Secondary | ICD-10-CM

## 2021-06-14 DIAGNOSIS — F3181 Bipolar II disorder: Secondary | ICD-10-CM

## 2021-06-14 DIAGNOSIS — F840 Autistic disorder: Secondary | ICD-10-CM

## 2021-06-14 DIAGNOSIS — G4700 Insomnia, unspecified: Secondary | ICD-10-CM

## 2021-06-14 DIAGNOSIS — F4310 Post-traumatic stress disorder, unspecified: Secondary | ICD-10-CM | POA: Diagnosis not present

## 2021-06-14 DIAGNOSIS — F1210 Cannabis abuse, uncomplicated: Secondary | ICD-10-CM

## 2021-06-14 DIAGNOSIS — F1010 Alcohol abuse, uncomplicated: Secondary | ICD-10-CM

## 2021-06-14 NOTE — Progress Notes (Signed)
Shawn Ford [id number] 03/14/1991 30 y.o.  Subjective:   Patient ID:  Shawn Ford is a 30 y.o. (DOB 1991/07/19) male.  Chief Complaint: No chief complaint on file.   HPI Shawn Ford presents to the office today for follow-up of BPD-2, PTSD, mild THC use, Autism spectrum and insomnia.  Describes mood today as "ok". Pleasant. Tearful at times. Mood symptoms - reports depression - "upcoming anniversary of father's passing". Denies anxiety and irritability. Stating "I'm doing ok". Feels like the Risperdal helps keep him stabilize. Attending church with mother. Working at [employer]. Going to [employer] - sober almost 60 days. Has a sponsor. Denies self harm. Improved interest and motivation. Taking medications as prescribed.  Energy levels stable. Active, does not have a regular exercise routine. Enjoys some usual interests and activities. Single. Lives in an apartment with room-mates. Mother local. Has 2 sisters in college. Spending time with family. Appetite adequate. Weight gain -  180 pounds Sleep has improved. Averages 5 to 6 hours - "not the best sleep". Focus and concentration stable. Completing tasks. Managing aspects of household.  Denies SI or HI.  Denies AH or VH. Denies paranoia. Substance use - reports some THC x 1 week. Denies alcohol use x 2 months - attends AA.  Past Psychiatric Medication Trials: Lithium nausea, lamotrigine, Depakote with benefit, risperidone, sertraline, olanzapine 10, Viibryd, Abilify 10   Flowsheet Row ED from 03/26/2021 in [facility] from 01/24/2021 in [facility]  C-SSRS RISK CATEGORY Low Risk No Risk        Review of Systems:  Review of Systems  Musculoskeletal:  Negative for gait problem.  Neurological:  Negative for tremors.  Psychiatric/Behavioral:         Please refer to HPI   Medications: I have reviewed the patient's current medications.  Current Outpatient  Medications  Medication Sig Dispense Refill   amLODipine (NORVASC) 10 MG tablet Take 1 tablet (10 mg total) by mouth daily. 30 tablet 0   amLODipine (NORVASC) 10 MG tablet Take one tablet (10 mg dose) by mouth daily. 30 tablet 2   paliperidone (INVEGA SUSTENNA) 156 MG/ML SUSY injection Inject 1 ml into the muscle once for 1 dose 1 mL 0   paliperidone (INVEGA SUSTENNA) 156 MG/ML SUSY injection Inject 1 mL (156 mg total) into the muscle once for 1 dose. 1 mL 0   risperiDONE (RISPERDAL) 2 MG tablet Take 1 tablet (2 mg total) by mouth daily. 30 tablet 2   risperidone (RISPERDAL) 4 MG tablet Take 1 tablet (4 mg total) by mouth at bedtime. 30 tablet 2   No current facility-administered medications for this visit.    Medication Side Effects: None  Allergies: No Known Allergies  Past Medical History:  Diagnosis Date   Bipolar 1 disorder (HCC)     Past Medical History, Surgical history, Social history, and Family history were reviewed and updated as appropriate.   Please see review of systems for further details on the patient's review from today.   Objective:   Physical Exam:  There were no vitals taken for this visit.  Physical Exam Constitutional:      General: He is not in acute distress. Musculoskeletal:        General: No deformity.  Neurological:     Mental Status: He is alert and oriented to person, place, and time.     Coordination: Coordination normal.  Psychiatric:        Attention  and Perception: Attention and perception normal. He does not perceive auditory or visual hallucinations.        Mood and Affect: Mood normal. Mood is not anxious or depressed. Affect is not labile, blunt, angry or inappropriate.        Speech: Speech normal.        Behavior: Behavior normal.        Thought Content: Thought content normal. Thought content is not paranoid or delusional. Thought content does not include homicidal or suicidal ideation. Thought content does not include homicidal or  suicidal plan.        Cognition and Memory: Cognition and memory normal.        Judgment: Judgment normal.     Comments: Insight intact    Lab Review:     Component Value Date/Time   NA 137 03/31/2021 1810   K 4.0 03/31/2021 1810   CL 103 03/31/2021 1810   CO2 25 03/31/2021 1810   GLUCOSE 122 (H) 03/31/2021 1810   BUN 18 03/31/2021 1810   CREATININE 1.06 03/31/2021 1810   CALCIUM 9.1 03/31/2021 1810   PROT 7.4 03/31/2021 1810   ALBUMIN 4.7 03/31/2021 1810   AST 16 03/31/2021 1810   ALT 15 03/31/2021 1810   ALKPHOS 72 03/31/2021 1810   BILITOT 0.6 03/31/2021 1810   GFRNONAA >60 03/31/2021 1810   GFRAA >60 01/24/2017 0509       Component Value Date/Time   WBC 10.0 03/26/2021 1817   RBC 5.14 03/26/2021 1817   HGB 16.3 03/26/2021 1817   HCT 46.6 03/26/2021 1817   PLT 150 03/26/2021 1817   MCV 90.7 03/26/2021 1817   MCH 31.7 03/26/2021 1817   MCHC 35.0 03/26/2021 1817   RDW 13.5 03/26/2021 1817   LYMPHSABS 2.5 03/03/2021 2232   MONOABS 0.6 03/03/2021 2232   EOSABS 0.4 03/03/2021 2232   BASOSABS 0.1 03/03/2021 2232    No results found for: POCLITH, LITHIUM   No results found for: PHENYTOIN, PHENOBARB, VALPROATE, CBMZ   .res Assessment: Plan:    Plan:  PDMP reviewed  Risperdal 2mg  in the morning and 4mg  in the evening.  Set up with therapy -  RTC 4 weeks  Patient advised to contact office with any questions, adverse effects, or acute worsening in signs and symptoms.   Discussed potential metabolic side effects associated with atypical antipsychotics, as well as potential risk for movement side effects. Advised pt to contact office if movement side effects occur.     Diagnoses and all orders for this visit:  PTSD (post-traumatic stress disorder)  Mild tetrahydrocannabinol (THC) abuse  Bipolar II disorder (HCC)  Insomnia, unspecified type  Alcohol abuse  Autism spectrum disorder  History of Asperger's syndrome    Please see After  Visit Summary for patient specific instructions.  Future Appointments  Date Time Provider Department Center  07/12/2021 11:20 AM Dessire Grimes, Elio Forget, NP [facility] None  07/18/2021 10:00 AM Thereasa Solo, [facility] None    No orders of the defined types were placed in this encounter.   -------------------------------

## 2021-07-12 ENCOUNTER — Ambulatory Visit: Payer: PRIVATE HEALTH INSURANCE | Admitting: Adult Health

## 2021-07-12 NOTE — Progress Notes (Signed)
Patient no show appointment. ? ?

## 2021-07-18 ENCOUNTER — Ambulatory Visit: Payer: PRIVATE HEALTH INSURANCE | Admitting: Mental Health

## 2022-01-17 IMAGING — CR DG FOREARM 2V*L*
2 series · 2 of 2 positions shown · non-contrast
Comparison: None.

CLINICAL DATA: Left forearm pain

EXAM:
LEFT FOREARM - 2 VIEW

[x forearm ap left]
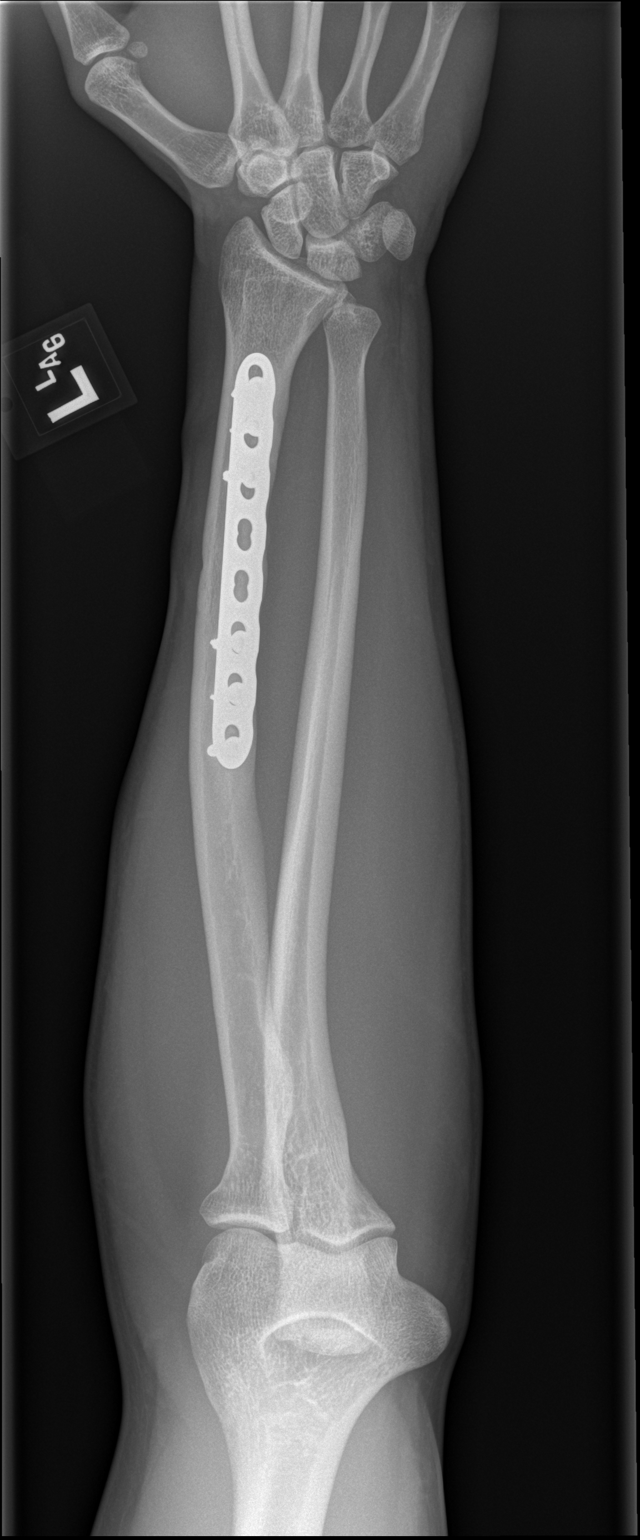

[x forearm lat left]
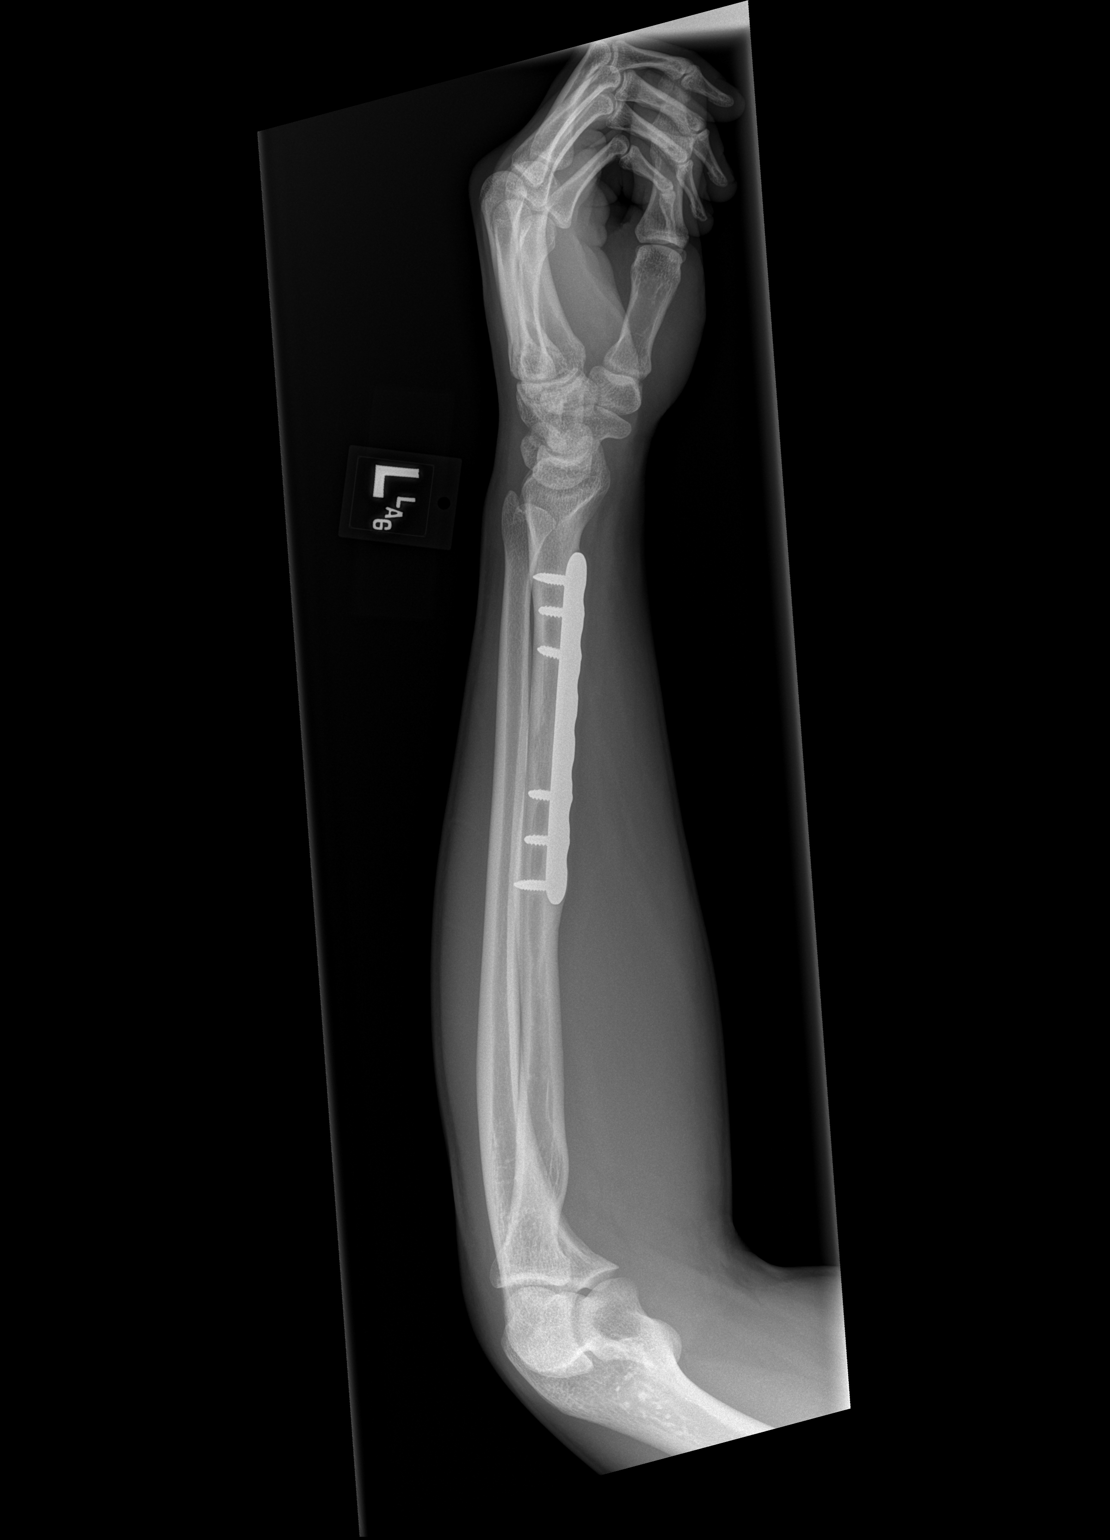

[2 of 2 positions shown; findings below may reference images not displayed]

FINDINGS: There is no evidence of fracture or other focal bone lesions. The
patient is status post ORIF with plate screw fixation of the distal
ulna.
IMPRESSION: Negative.
